# Patient Record
Sex: Female | Born: 1955 | Race: White | Hispanic: No | Marital: Married | Smoking: Never smoker
Health system: Southern US, Community
[De-identification: ages and names within clinical notes are randomized; demographics above are authoritative.]

## PROBLEM LIST (undated history)

## (undated) DIAGNOSIS — Z8739 Personal history of other diseases of the musculoskeletal system and connective tissue: Secondary | ICD-10-CM

## (undated) DIAGNOSIS — I8390 Asymptomatic varicose veins of unspecified lower extremity: Secondary | ICD-10-CM

## (undated) DIAGNOSIS — N949 Unspecified condition associated with female genital organs and menstrual cycle: Secondary | ICD-10-CM

## (undated) DIAGNOSIS — K269 Duodenal ulcer, unspecified as acute or chronic, without hemorrhage or perforation: Secondary | ICD-10-CM

## (undated) DIAGNOSIS — E785 Hyperlipidemia, unspecified: Secondary | ICD-10-CM

## (undated) DIAGNOSIS — M810 Age-related osteoporosis without current pathological fracture: Secondary | ICD-10-CM

## (undated) DIAGNOSIS — IMO0001 Reserved for inherently not codable concepts without codable children: Secondary | ICD-10-CM

## (undated) DIAGNOSIS — K5790 Diverticulosis of intestine, part unspecified, without perforation or abscess without bleeding: Secondary | ICD-10-CM

## (undated) DIAGNOSIS — M1990 Unspecified osteoarthritis, unspecified site: Secondary | ICD-10-CM

## (undated) DIAGNOSIS — K8020 Calculus of gallbladder without cholecystitis without obstruction: Secondary | ICD-10-CM

## (undated) DIAGNOSIS — K5792 Diverticulitis of intestine, part unspecified, without perforation or abscess without bleeding: Secondary | ICD-10-CM

## (undated) DIAGNOSIS — R030 Elevated blood-pressure reading, without diagnosis of hypertension: Secondary | ICD-10-CM

## (undated) DIAGNOSIS — K862 Cyst of pancreas: Secondary | ICD-10-CM

## (undated) DIAGNOSIS — I700 Atherosclerosis of aorta: Secondary | ICD-10-CM

## (undated) DIAGNOSIS — D649 Anemia, unspecified: Secondary | ICD-10-CM

## (undated) DIAGNOSIS — T148XXA Other injury of unspecified body region, initial encounter: Secondary | ICD-10-CM

## (undated) DIAGNOSIS — K828 Other specified diseases of gallbladder: Secondary | ICD-10-CM

## (undated) HISTORY — PX: TOE SURGERY: SHX1073

## (undated) HISTORY — PX: ABDOMINAL HYSTERECTOMY: SHX81

## (undated) HISTORY — PX: SHOULDER SURGERY: SHX246

## (undated) HISTORY — DX: Atherosclerosis of aorta: I70.0

## (undated) HISTORY — DX: Calculus of gallbladder without cholecystitis without obstruction: K80.20

## (undated) HISTORY — PX: MANDIBLE SURGERY: SHX707

## (undated) HISTORY — DX: Cyst of pancreas: K86.2

## (undated) HISTORY — DX: Anemia, unspecified: D64.9

## (undated) HISTORY — PX: COLONOSCOPY: SHX174

## (undated) HISTORY — PX: INCONTINENCE SURGERY: SHX676

## (undated) HISTORY — PX: BACK SURGERY: SHX140

## (undated) HISTORY — PX: ABLATION: SHX5711

## (undated) HISTORY — DX: Unspecified condition associated with female genital organs and menstrual cycle: N94.9

## (undated) HISTORY — PX: BREAST BIOPSY: SHX20

## (undated) HISTORY — PX: DORSAL COMPARTMENT RELEASE: SHX1474

## (undated) HISTORY — DX: Diverticulosis of intestine, part unspecified, without perforation or abscess without bleeding: K57.90

## (undated) HISTORY — PX: LUMBAR LAMINECTOMY: SHX95

## (undated) HISTORY — PX: ROTATOR CUFF REPAIR: SHX139

---

## 1988-02-02 DIAGNOSIS — D649 Anemia, unspecified: Secondary | ICD-10-CM

## 1988-02-02 HISTORY — DX: Anemia, unspecified: D64.9

## 1988-02-02 HISTORY — PX: VAGINAL HYSTERECTOMY: SUR661

## 1999-04-02 HISTORY — PX: LUMBAR LAMINECTOMY: SHX95

## 2004-07-02 ENCOUNTER — Ambulatory Visit: Payer: Self-pay

## 2004-12-01 ENCOUNTER — Ambulatory Visit: Payer: Self-pay | Admitting: Internal Medicine

## 2005-01-22 ENCOUNTER — Ambulatory Visit: Payer: Self-pay | Admitting: Gastroenterology

## 2006-01-10 ENCOUNTER — Ambulatory Visit: Payer: Self-pay | Admitting: Internal Medicine

## 2007-02-28 ENCOUNTER — Ambulatory Visit: Payer: Self-pay

## 2007-06-16 ENCOUNTER — Ambulatory Visit: Payer: Self-pay | Admitting: Vascular Surgery

## 2007-09-05 ENCOUNTER — Ambulatory Visit: Payer: Self-pay

## 2007-09-22 ENCOUNTER — Ambulatory Visit: Payer: Self-pay | Admitting: Vascular Surgery

## 2007-10-18 ENCOUNTER — Ambulatory Visit: Payer: Self-pay | Admitting: Vascular Surgery

## 2007-10-25 ENCOUNTER — Ambulatory Visit: Payer: Self-pay | Admitting: Vascular Surgery

## 2007-11-01 ENCOUNTER — Ambulatory Visit: Payer: Self-pay | Admitting: Vascular Surgery

## 2007-11-08 ENCOUNTER — Ambulatory Visit: Payer: Self-pay | Admitting: Vascular Surgery

## 2008-03-13 ENCOUNTER — Ambulatory Visit: Payer: Self-pay | Admitting: Internal Medicine

## 2008-03-20 ENCOUNTER — Ambulatory Visit: Payer: Self-pay | Admitting: Vascular Surgery

## 2008-07-03 ENCOUNTER — Ambulatory Visit: Payer: Self-pay | Admitting: Vascular Surgery

## 2009-03-18 ENCOUNTER — Ambulatory Visit: Payer: Self-pay | Admitting: Internal Medicine

## 2009-11-19 ENCOUNTER — Ambulatory Visit: Payer: Self-pay | Admitting: Internal Medicine

## 2010-05-19 ENCOUNTER — Ambulatory Visit: Payer: Self-pay | Admitting: Internal Medicine

## 2010-06-16 NOTE — Procedures (Signed)
DUPLEX DEEP VENOUS EXAM - LOWER EXTREMITY   INDICATION:  Follow up one week post left greater saphenous vein  ablation.   HISTORY:  Edema:  No.  Trauma/Surgery:  Left greater saphenous vein ablation on 11/01/07,  previous right greater saphenous vein ablation.  Pain:  Mild left leg pain.  PE:  No.  Previous DVT:  No.  Anticoagulants:  No.  Other:   DUPLEX EXAM:                CFV   SFV   PopV  PTV    GSV                R  L  R  L  R  L  R   L  R  L  Thrombosis       o     o     o      o     +  Spontaneous      +     +     +      +     o  Phasic           +     +     +      +     o  Augmentation     +     +     +      +     o  Compressible     +     +     +      +     o  Competent        +     +     +      +     o   Legend:  + - yes  o - no  p - partial  D - decreased   IMPRESSION:  1. Left greater saphenous vein is thrombosed.  No thrombus is seen in      the common femoral vein.  2. No deep venous thrombosis is seen in the left leg.    _____________________________  Larina Earthly, M.D.   MC/MEDQ  D:  11/08/2007  T:  11/08/2007  Job:  [id number]

## 2010-06-16 NOTE — Assessment & Plan Note (Signed)
OFFICE VISIT   Heather Snow, Heather Snow  DOB:  Dec 10, 1955                                       07/03/2008  CHART#:[id number]   The patient presents today for continued treatment of her venous  pathology.  She had been treated with bilateral laser ablation and had  excellent result with this from a symptom relief standpoint.  She had  had one session of sclerotherapy to telangiectasia over both calves.  She is here today for a second treatment.   She underwent a total of 2 mL of 0.3% sodium tetradecyl mixed half and  half with CO2.  These were to the posterior calves and lateral left  knee.  She had no immediate complications and had her stockings replaced  and will see [place] again on an as-needed basis.   Larina Earthly, M.D.  Electronically Signed   TFE/MEDQ  D:  07/03/2008  T:  07/04/2008  Job:  [id number]

## 2010-06-16 NOTE — Procedures (Signed)
LOWER EXTREMITY VENOUS REFLUX EXAM   INDICATION:  Pain and swelling in the bilateral lower extremities.   EXAM:  Using color-flow imaging and pulse Doppler spectral analysis, the  bilateral common femoral, superficial femoral, popliteal, posterior  tibial, greater and lesser saphenous veins are evaluated.  There is  evidence suggesting deep venous insufficiency in the bilateral lower  extremities.   The right saphenofemoral junction is competent.  The left saphenofemoral  junction is not competent.  The bilateral GSV's are not competent with  the caliber as described below.   The bilateral proximal short saphenous veins were not adequately  visualized.   GSV Diameter (used if found to be incompetent only)                                            Right    Left  Proximal Greater Saphenous Vein           0.6 cm   0.73 cm  Proximal-to-mid-thigh                     0.66 cm  0.73 cm  Mid thigh                                 0.53 cm  0.59 cm  Mid-distal thigh                          0.53 cm  0.59 cm  Distal thigh                              0.55 cm  0.52 cm  Knee                                      0.33 cm  0.34 cm    IMPRESSION:  1. Bilateral greater saphenous vein reflux is identified with the      caliber ranging from 0.3 cm to 0.66 cm knee to groin on the right      and 0.34 cm to 0.73 cm knee to groin on the left.  2. The bilateral greater saphenous veins are not aneurysmal or      tortuous.  3. The bilateral deep venous system is not competent.         ___________________________________________  Larina Earthly, M.D.   CH/MEDQ  D:  09/22/2007  T:  09/22/2007  Job:  [id number]

## 2010-06-16 NOTE — Procedures (Signed)
DUPLEX DEEP VENOUS EXAM - LOWER EXTREMITY   INDICATION:  Status post right greater saphenous vein ablation.   HISTORY:  Edema:  No.  Trauma/Surgery:  Yes.  Pain:  No.  PE:  No.  Previous DVT:  No.  Anticoagulants:  Other:   DUPLEX EXAM:                CFV   SFV   PopV  PTV    GSV                R  L  R  L  R  L  R   L  R  L  Thrombosis    o  o  o     o     o      +  Spontaneous   +  +  +     +     +      0  Phasic        +  +  +     +     +      0  Augmentation  +  +  +     +     +      0  Compressible  +  +  +     +     +      0  Competent     +  +  +     +     +      0   Legend:  + - yes  o - no  p - partial  D - decreased   IMPRESSION:  1. No evidence of deep venous thrombosis noted in the right leg.  2. The right greater saphenous vein appears ablated from the proximal      thigh to distal thigh.    _____________________________  Larina Earthly, M.D.   MG/MEDQ  D:  10/25/2007  T:  10/25/2007  Job:  [id number]

## 2010-06-16 NOTE — Consult Note (Signed)
NEW PATIENT CONSULTATION   Heather Snow, Heather Snow  DOB:  [birth year]                                       06/16/2007  CHART#:[id number]   The patient presents today for evaluation of severe bilateral venous  pathology.  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  She does have a strong family  history of venous varicosities in her mother and father.  She also has a  history of thrombophlebitis and varicosities in a sister.   PAST MEDICAL HISTORY:  Is significant for elevated cholesterol but  otherwise no major medical difficulties.  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  She reports  that she is having progressively severe pain associated with this, with  an aching sensation in her legs after a long day and also pain  specifically over the large varicosities in both legs.  She does not  have any history of deep venous thrombosis or bleeding.   SOCIAL HISTORY:  She is married with two children.  She works as an Charity fundraiser.  She does not smoke, or drink alcohol.   REVIEW OF SYSTEMS:  Positive for weight gain.  She currently reports her  weight at 145 pounds.  She is 5 feet 7 inches tall.  Her review of systems otherwise positive for a history of peptic ulcer  disease, does have a history of superficial thrombophlebitis.  ORTHO:  She does have arthritic and joint pain in her shoulder and hip.  HEMATOLOGIC:  She did have slightly low platelets.   ALLERGIES:  No known drug allergies.   MEDICATIONS:  Calcium, Fosamax, glucosamine, daily baby aspirin, and  Celebrex.   PHYSICAL EXAM:  Well-developed, well-nourished white female appearing  stated age of 2.  Blood pressure is 128/82, pulse 73, respirations 18.  Her radial pulses are 2+ bilaterally.  She has 2+ dorsalis pedis pulses  bilaterally.  Lower extremities are noted for marked bilateral  varicosities.  These continue throughout  her medial right thigh, and  typical aberrant pattern going from her groin down to her lateral knee  on the left and extending onto her calf.  She also has medial thigh  varicosities on the left as well.  She underwent screening duplex by me  and this revealed gross reflux in her saphenous vein in her upper and  mid thigh.  This does give rise to the varicosities at her mid to distal  thigh.  I had a long discussion with this patient regarding the  significance of this.  I feel that she does have significant symptoms  related to her venous hypertension both with leg achiness at the end of  the day and also pain specifically over the varicosities.  She does wear  a heavy-grade support hose, but has not worn graduated compression  garments.  We have fitted her today with a 20-30 mmHg thigh-high  graduated compression garments and instructed her on the use of these.  Will see her again in 3 months for a formal duplex evaluation to  determine if she is having successful symptom relief with conservative  care.  She is instructed on the continued elevation when possible and  also nonsteroidals for pain.  We will see her again in 3 months for  continued discussion.  Larina Earthly, M.D.  Electronically Signed   TFE/MEDQ  D:  06/16/2007  T:  06/19/2007  Job:  [id number]   cc:   Max T. Hyatt, D.P.M.  Dr. Mancel Bale

## 2010-06-16 NOTE — Assessment & Plan Note (Signed)
OFFICE VISIT   BREELEY, BISCHOF  DOB:  Jul 01, 1955                                       11/08/2007  CHART#:[id number]   Here for followup of her staged bilateral laser ablation and stab  phlebectomies most recently her left leg 1 week ago.  She is quite  thrilled with her result.  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  She reports that  she has had no discomfort with this, and this is the first time in years  she has not had pain with prolonged standing.  Her ablation site looks  good with no evidence of difficulties.  She did undergo repeat duplex  today of her left leg showing closure of her saphenous vein throughout  the treated segment and patency of her common femoral vein.  I am quite  pleased with her result, as is the patient.  She will see [place] again in 6  weeks for final followup.   Larina Earthly, M.D.  Electronically Signed   TFE/MEDQ  D:  11/08/2007  T:  11/09/2007  Job:  [id number]

## 2010-06-16 NOTE — Assessment & Plan Note (Signed)
OFFICE VISIT   Heather Snow, Heather Snow  DOB:  Mar 11, 1955                                       09/22/2007  CHART#:[id number]   Patient presents today for continued followup of her severe venous  hypertension and bilateral venous varicosities.  She has worn her  compression garments for the past three months and reports that she  continues to have discomfort with pain at the end of a prolonged shift.  She works as a Designer, jewellery with the correctional facility and has  prolonged standing up to 12 hour shifts and reports that her pain and  swelling makes it difficult to perform her job.  She also reports that  squatting, doing housework and yard work is difficult, and walks for  exercise but has to decrease this due to leg pain and swelling.   Her physical exam is unchanged.  She continues to have varicosities in  the medial aspect of both thighs and calves.   She underwent a formal venous duplex today, and this shows incompetence  in her greater saphenous veins bilaterally.  This does give reflux into  the tributary varicosities in both legs.   I discussed options with the patient.  She is an excellent candidate for  laser ablation and stab phlebectomy for relief of her venous  hypertension symptoms.  I explained the procedure under local anesthesia  with ablation of her saphenous vein and stab phlebectomy of the  tributary varicosities.  She wishes to proceed with this as soon as we  can assure insurance coverage.  She understands it would be a staged  procedure.   Larina Earthly, M.D.  Electronically Signed   TFE/MEDQ  D:  09/22/2007  T:  09/22/2007  Job:  [id number]

## 2010-06-16 NOTE — Assessment & Plan Note (Signed)
OFFICE VISIT   Heather Snow, Heather Snow  DOB:  April 15, 1955                                       03/20/2008  CHART#:[id number]   The patient is in today for follow-up of her bilateral of laser ablation  stab phlebectomies of venous varicosities.  She has had marked symptom  relief of her venous hypertension.  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  Discussed the procedure with her and then a total of 4 cc of 0.3% sodium  tetradecyl in 50/50 mix with CO2.  We focused on the areas on the  anterior calves and posterior calves and thighs.  She had no immediate  complication.  Her compression garments were applied and she will see me  again in 3-4 weeks.   Larina Earthly, M.D.  Electronically Signed   TFE/MEDQ  D:  03/20/2008  T:  03/21/2008  Job:  [id number]

## 2010-06-16 NOTE — Assessment & Plan Note (Signed)
OFFICE VISIT   Heather, Snow  DOB:  1955/07/20                                       10/25/2007  CHART#:[id number]   Patient presents today for a one-week followup of her laser ablation of  her right greater saphenous vein and stab phlebectomy of multiple  tributary varicosities.  She is quite pleased with her admission result  and reports that her leg feels much better than preoperative.   PHYSICAL EXAMINATION:  She does have the usual amount of bruising at the  phlebectomy sites throughout her thigh and calf.   She underwent duplex in our office, and this reveals no injury to her  deep system with wide patency and no thrombus and closure of her greater  saphenous vein.   She is quite pleased with her initial result, as am I, and plan to see  her again in one week for ablation and stab phlebectomy of her  contralateral greater saphenous vein on the left.   Larina Earthly, M.D.  Electronically Signed   TFE/MEDQ  D:  10/25/2007  T:  10/26/2007  Job:  [id number]   cc:   Max T. Hyatt, D.P.M.  Dr. Patric Dykes

## 2011-05-20 ENCOUNTER — Ambulatory Visit: Payer: Self-pay | Admitting: Internal Medicine

## 2011-11-23 ENCOUNTER — Ambulatory Visit: Payer: Self-pay | Admitting: Internal Medicine

## 2011-12-03 HISTORY — PX: DORSAL COMPARTMENT RELEASE: SHX1474

## 2012-07-05 ENCOUNTER — Ambulatory Visit: Payer: Self-pay | Admitting: Internal Medicine

## 2013-09-26 ENCOUNTER — Ambulatory Visit: Payer: Self-pay | Admitting: Internal Medicine

## 2013-12-19 ENCOUNTER — Ambulatory Visit: Payer: Self-pay | Admitting: Podiatry

## 2014-05-06 ENCOUNTER — Emergency Department
Admit: 2014-05-06 | Disposition: A | Discharge status: Home / Self Care | Payer: Self-pay | Admitting: Emergency Medicine

## 2014-05-15 ENCOUNTER — Encounter: Payer: Self-pay | Admitting: *Deleted

## 2014-11-01 ENCOUNTER — Other Ambulatory Visit: Payer: Self-pay | Admitting: Internal Medicine

## 2014-11-01 DIAGNOSIS — Z1231 Encounter for screening mammogram for malignant neoplasm of breast: Secondary | ICD-10-CM

## 2014-11-13 ENCOUNTER — Ambulatory Visit
Admission: RE | Admit: 2014-11-13 | Discharge: 2014-11-13 | Disposition: A | Discharge status: Home / Self Care | Payer: BC Managed Care – PPO | Source: Ambulatory Visit | Attending: Internal Medicine | Admitting: Internal Medicine

## 2014-11-13 DIAGNOSIS — Z1231 Encounter for screening mammogram for malignant neoplasm of breast: Secondary | ICD-10-CM

## 2015-01-16 ENCOUNTER — Encounter: Payer: Self-pay | Admitting: *Deleted

## 2015-01-17 ENCOUNTER — Ambulatory Visit: Payer: BC Managed Care – PPO | Admitting: Certified Registered"

## 2015-01-17 ENCOUNTER — Ambulatory Visit
Admission: RE | Admit: 2015-01-17 | Discharge: 2015-01-17 | Disposition: A | Discharge status: Home / Self Care | Payer: BC Managed Care – PPO | Source: Ambulatory Visit | Attending: Gastroenterology | Admitting: Gastroenterology

## 2015-01-17 ENCOUNTER — Encounter
Admission: RE | Disposition: A | Discharge status: Home / Self Care | Payer: Self-pay | Source: Ambulatory Visit | Attending: Gastroenterology

## 2015-01-17 ENCOUNTER — Encounter: Payer: Self-pay | Admitting: *Deleted

## 2015-01-17 DIAGNOSIS — I739 Peripheral vascular disease, unspecified: Secondary | ICD-10-CM | POA: Diagnosis not present

## 2015-01-17 DIAGNOSIS — M1990 Unspecified osteoarthritis, unspecified site: Secondary | ICD-10-CM | POA: Diagnosis not present

## 2015-01-17 DIAGNOSIS — Z7982 Long term (current) use of aspirin: Secondary | ICD-10-CM | POA: Insufficient documentation

## 2015-01-17 DIAGNOSIS — Z1211 Encounter for screening for malignant neoplasm of colon: Secondary | ICD-10-CM | POA: Insufficient documentation

## 2015-01-17 DIAGNOSIS — Z79899 Other long term (current) drug therapy: Secondary | ICD-10-CM | POA: Insufficient documentation

## 2015-01-17 DIAGNOSIS — M810 Age-related osteoporosis without current pathological fracture: Secondary | ICD-10-CM | POA: Insufficient documentation

## 2015-01-17 DIAGNOSIS — E785 Hyperlipidemia, unspecified: Secondary | ICD-10-CM | POA: Insufficient documentation

## 2015-01-17 DIAGNOSIS — K5730 Diverticulosis of large intestine without perforation or abscess without bleeding: Secondary | ICD-10-CM | POA: Insufficient documentation

## 2015-01-17 HISTORY — DX: Unspecified osteoarthritis, unspecified site: M19.90

## 2015-01-17 HISTORY — DX: Elevated blood-pressure reading, without diagnosis of hypertension: R03.0

## 2015-01-17 HISTORY — DX: Duodenal ulcer, unspecified as acute or chronic, without hemorrhage or perforation: K26.9

## 2015-01-17 HISTORY — DX: Reserved for inherently not codable concepts without codable children: IMO0001

## 2015-01-17 HISTORY — DX: Hyperlipidemia, unspecified: E78.5

## 2015-01-17 HISTORY — PX: COLONOSCOPY WITH PROPOFOL: SHX5780

## 2015-01-17 HISTORY — DX: Personal history of other diseases of the musculoskeletal system and connective tissue: Z87.39

## 2015-01-17 HISTORY — DX: Age-related osteoporosis without current pathological fracture: M81.0

## 2015-01-17 HISTORY — DX: Other injury of unspecified body region, initial encounter: T14.8XXA

## 2015-01-17 HISTORY — DX: Asymptomatic varicose veins of unspecified lower extremity: I83.90

## 2015-01-17 LAB — BASIC METABOLIC PANEL
ANION GAP: 7 (ref 5–15)
BUN: 12 mg/dL (ref 6–20)
CALCIUM: 9 mg/dL (ref 8.9–10.3)
CO2: 28 mmol/L (ref 22–32)
CREATININE: 0.73 mg/dL (ref 0.44–1.00)
Chloride: 101 mmol/L (ref 101–111)
GLUCOSE: 93 mg/dL (ref 65–99)
Potassium: 4 mmol/L (ref 3.5–5.1)
Sodium: 136 mmol/L (ref 135–145)

## 2015-01-17 LAB — MAGNESIUM: Magnesium: 2 mg/dL (ref 1.7–2.4)

## 2015-01-17 SURGERY — COLONOSCOPY WITH PROPOFOL
Anesthesia: General

## 2015-01-17 MED ORDER — PROPOFOL 10 MG/ML IV BOLUS
INTRAVENOUS | Status: DC | PRN
  Administered 2015-01-17: 50 mg via INTRAVENOUS

## 2015-01-17 MED ORDER — PHENYLEPHRINE HCL 10 MG/ML IJ SOLN
INTRAMUSCULAR | Status: DC | PRN
  Administered 2015-01-17: 100 ug via INTRAVENOUS

## 2015-01-17 MED ORDER — SODIUM CHLORIDE 0.9 % IV SOLN: INTRAVENOUS | Status: DC

## 2015-01-17 MED ORDER — SODIUM CHLORIDE 0.9 % IV SOLN
INTRAVENOUS | Status: DC
  Administered 2015-01-17: 13:00:00 via INTRAVENOUS

## 2015-01-17 MED ORDER — FENTANYL CITRATE (PF) 100 MCG/2ML IJ SOLN
INTRAMUSCULAR | Status: DC | PRN
  Administered 2015-01-17: 25 ug via INTRAVENOUS

## 2015-01-17 MED ORDER — LIDOCAINE HCL (CARDIAC) 20 MG/ML IV SOLN
INTRAVENOUS | Status: DC | PRN
Start: 2015-01-17 — End: 2015-01-17
  Administered 2015-01-17: 60 mg via INTRAVENOUS

## 2015-01-17 MED ORDER — PROPOFOL 500 MG/50ML IV EMUL
INTRAVENOUS | Status: DC | PRN
Start: 2015-01-17 — End: 2015-01-17
  Administered 2015-01-17: 140 ug/kg/min via INTRAVENOUS

## 2015-01-17 NOTE — Transfer of Care (Signed)
Immediate Anesthesia Transfer of Care Note  Patient: Heather Snow  Procedure(s) Performed: Procedure(s): COLONOSCOPY WITH PROPOFOL (N/A)  Patient Location: Endoscopy Unit  Anesthesia Type:General  Level of Consciousness: awake, alert , oriented and patient cooperative  Airway & Oxygen Therapy: Patient Spontanous Breathing and Patient connected to nasal cannula oxygen  Post-op Assessment: Report given to RN, Post -op Vital signs reviewed and stable and Patient moving all extremities X 4  Post vital signs: Reviewed and stable  Last Vitals:  Filed Vitals:   01/17/15 1255  BP: 155/87  Pulse: 72  Temp: 36.5 C  Resp: 22    Complications: No apparent anesthesia complications
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
## 2015-01-17 NOTE — Op Note (Signed)
[facility] Gastroenterology Patient Name: Heather Snow Procedure Date: 01/17/2015 1:29 PM MRN: [id number] Account #: [id number] Date of Birth: 1955/06/03 Admit Type: Outpatient Age: [age] [facility] Gender: Female Note Status: Finalized Procedure:         Colonoscopy Indications:       Screening for colorectal malignant neoplasm Providers:         Christena DeemMartin U. Yanni Ruberg, MD Referring MD:      Daniel NonesBert Klein, MD (Referring MD) Medicines:         Monitored Anesthesia Care Complications:     No immediate complications. Procedure:         Pre-Anesthesia Assessment:                    - ASA Grade Assessment: II - A patient with mild systemic                     disease.                    After obtaining informed consent, the colonoscope was                     passed under direct vision. Throughout the procedure, the                     patient's blood pressure, pulse, and oxygen saturations                     were monitored continuously. The Colonoscope was                     introduced through the anus and advanced to the the cecum,                     identified by appendiceal orifice and ileocecal valve. The                     colonoscopy was performed with moderate difficulty due to                     a tortuous colon. Successful completion of the procedure                     was aided by using manual pressure. Findings:      Multiple small-mouthed diverticula were found in the sigmoid colon, in       the descending colon, in the transverse colon and in the ascending       colon. The ileocecal valve was turned open and noted to be normal.      The digital rectal exam was normal.      The sigmoid colon, descending colon, splenic flexure and hepatic flexure       were significantly tortuous. Impression:        - Diverticulosis in the sigmoid colon, in the descending                     colon, in the transverse colon and in the ascending colon.         - No specimens collected. Recommendation:    - Repeat colonoscopy in 10 years for screening purposes. Procedure Code(s): --- Professional ---                    [id number], Colonoscopy, flexible; diagnostic,  including                     collection of specimen(s) by brushing or washing, when                     performed (separate procedure) Diagnosis Code(s): --- Professional ---                    Z12.11, Encounter for screening for malignant neoplasm of                     colon                    K57.30, Diverticulosis of large intestine without                     perforation or abscess without bleeding CPT copyright 2014 American Medical Association. All rights reserved. The codes documented in this report are preliminary and upon coder review may  be revised to meet current compliance requirements. Christena Deem, MD 01/17/2015 2:14:56 PM This report has been signed electronically. Number of Addenda: 0 Note Initiated On: 01/17/2015 1:29 PM Scope Withdrawal Time: 0 hours 8 minutes 53 seconds  Total Procedure Duration: 0 hours 28 minutes 42 seconds       [facility]

## 2015-01-17 NOTE — Anesthesia Postprocedure Evaluation (Signed)
Anesthesia Post Note  Patient: Heather Snow  Procedure(s) Performed: Procedure(s) (LRB): COLONOSCOPY WITH PROPOFOL (N/A)  Patient location during evaluation: PACU Anesthesia Type: General Level of consciousness: awake and alert Pain management: pain level controlled Vital Signs Assessment: post-procedure vital signs reviewed and stable Respiratory status: spontaneous breathing, nonlabored ventilation, respiratory function stable and patient connected to nasal cannula oxygen Cardiovascular status: blood pressure returned to baseline and stable Postop Assessment: no signs of nausea or vomiting Anesthetic complications: no    Last Vitals:  Filed Vitals:   01/17/15 1436 01/17/15 1446  BP: 128/71 141/71  Pulse: 59 55  Temp:    Resp: 16 15    Last Pain: There were no vitals filed for this visit.               Yevette EdwardsJames G Adams

## 2015-01-17 NOTE — Anesthesia Preprocedure Evaluation (Signed)
Anesthesia Evaluation  Patient identified by MRN, date of birth, ID band Patient awake    Reviewed: Allergy & Precautions, H&P , NPO status , Patient's Chart, lab work & pertinent test results, reviewed documented beta blocker date and time   Airway Mallampati: II  TM Distance: >3 FB Neck ROM: full    Dental no notable dental hx.    Pulmonary neg pulmonary ROS,    Pulmonary exam normal breath sounds clear to auscultation       Cardiovascular Exercise Tolerance: Good + Peripheral Vascular Disease  negative cardio ROS   Rhythm:regular Rate:Normal     Neuro/Psych negative neurological ROS  negative psych ROS   GI/Hepatic negative GI ROS, Neg liver ROS, PUD,   Endo/Other  negative endocrine ROS  Renal/GU negative Renal ROS  negative genitourinary   Musculoskeletal negative musculoskeletal ROS (+) Arthritis ,   Abdominal   Peds negative pediatric ROS (+)  Hematology negative hematology ROS (+)   Anesthesia Other Findings   Reproductive/Obstetrics negative OB ROS                             Anesthesia Physical Anesthesia Plan  ASA: II  Anesthesia Plan: General   Post-op Pain Management:    Induction:   Airway Management Planned:   Additional Equipment:   Intra-op Plan:   Post-operative Plan:   Informed Consent: I have reviewed the patients History and Physical, chart, labs and discussed the procedure including the risks, benefits and alternatives for the proposed anesthesia with the patient or authorized representative who has indicated his/her understanding and acceptance.   Dental Advisory Given  Plan Discussed with: CRNA  Anesthesia Plan Comments:         Anesthesia Quick Evaluation

## 2015-01-18 ENCOUNTER — Encounter: Payer: Self-pay | Admitting: Gastroenterology

## 2015-10-15 ENCOUNTER — Other Ambulatory Visit: Payer: Self-pay | Admitting: Internal Medicine

## 2015-10-15 DIAGNOSIS — Z1231 Encounter for screening mammogram for malignant neoplasm of breast: Secondary | ICD-10-CM

## 2015-11-17 ENCOUNTER — Ambulatory Visit: Payer: BC Managed Care – PPO

## 2015-12-04 ENCOUNTER — Ambulatory Visit
Admission: RE | Admit: 2015-12-04 | Discharge: 2015-12-04 | Disposition: A | Discharge status: Home / Self Care | Payer: BC Managed Care – PPO | Source: Ambulatory Visit | Attending: Internal Medicine | Admitting: Internal Medicine

## 2015-12-04 DIAGNOSIS — Z1231 Encounter for screening mammogram for malignant neoplasm of breast: Secondary | ICD-10-CM | POA: Insufficient documentation

## 2016-10-28 ENCOUNTER — Other Ambulatory Visit: Payer: Self-pay | Admitting: Internal Medicine

## 2016-10-28 DIAGNOSIS — K862 Cyst of pancreas: Secondary | ICD-10-CM

## 2016-10-28 DIAGNOSIS — Z1231 Encounter for screening mammogram for malignant neoplasm of breast: Secondary | ICD-10-CM

## 2016-10-28 DIAGNOSIS — N949 Unspecified condition associated with female genital organs and menstrual cycle: Secondary | ICD-10-CM

## 2016-12-30 ENCOUNTER — Other Ambulatory Visit: Payer: Self-pay | Admitting: Internal Medicine

## 2016-12-30 ENCOUNTER — Ambulatory Visit
Admission: RE | Admit: 2016-12-30 | Discharge: 2016-12-30 | Disposition: A | Discharge status: Home / Self Care | Payer: BC Managed Care – PPO | Source: Ambulatory Visit | Attending: Internal Medicine | Admitting: Internal Medicine

## 2016-12-30 DIAGNOSIS — Z1231 Encounter for screening mammogram for malignant neoplasm of breast: Secondary | ICD-10-CM

## 2017-02-01 HISTORY — PX: BLADDER SUSPENSION: SHX72

## 2017-02-01 HISTORY — PX: BILATERAL OOPHORECTOMY: SHX1221

## 2017-03-22 ENCOUNTER — Ambulatory Visit: Payer: BC Managed Care – PPO | Attending: Internal Medicine | Admitting: Physical Therapy

## 2017-03-22 DIAGNOSIS — M542 Cervicalgia: Secondary | ICD-10-CM | POA: Insufficient documentation

## 2017-03-22 DIAGNOSIS — M6281 Muscle weakness (generalized): Secondary | ICD-10-CM | POA: Insufficient documentation

## 2017-03-24 NOTE — Therapy (Signed)
[facility] [address] [place], [place], [address] Phone: [phone]   Fax:  [phone]  Physical Therapy Evaluation  Patient Details  Name: Heather Snow Raiche MRN: [id number] Date of Birth: 04-02-1955 Referring Provider: Dr. Daniel NonesBert Klein   Encounter Date: 03/22/2017  PT End of Session - 03/24/17 1529    Visit Number  1    Number of Visits  1    Date for PT Re-Evaluation  03/23/17    PT Start Time  1502    PT Stop Time  1654    PT Time Calculation (min)  112 min    Activity Tolerance  Patient tolerated treatment well;Patient limited by pain    Behavior During Therapy  [facility] for tasks assessed/performed       Past Medical History:  Diagnosis Date  . Crush injury    to the maxilla and mandible with surgical repair of this  . Duodenal ulcer    remote, questionalbe duodenal stricture as a residual from this  . Elevated BP   . H/O degenerative disc disease   . Hyperlipidemia   . Osteoarthritis   . Osteoporosis   . Varicosities of leg    bilateral    Past Surgical History:  Procedure Laterality Date  . ABDOMINAL HYSTERECTOMY    . ABLATION     s/p vein ablation  . BREAST BIOPSY Right 2000-?   benign- circular clip  . COLONOSCOPY    . COLONOSCOPY WITH PROPOFOL N/A 01/17/2015   Procedure: COLONOSCOPY WITH PROPOFOL;  Surgeon: Christena DeemMartin U Skulskie, MD;  [facility];  Service: Endoscopy;  Laterality: N/A;  . DORSAL COMPARTMENT RELEASE Left    first   . LUMBAR LAMINECTOMY    . MANDIBLE SURGERY     maxilla and mandible; crush injury repair    There were no vitals filed for this visit.       [facility] - 03/24/17 0001      Assessment   Medical Diagnosis  DJD/ Cervical Radiculopathy    Referring Provider  Dr. Daniel NonesBert Klein    Onset Date/Surgical Date  05/06/14    Prior Therapy  yes      Precautions   Precautions  Other (comment)    Precaution Comments  8# lifting restriction due to recent OBGYN  procedure.               Plan - 03/24/17 1751    Clinical Impression Statement  See FCE report.      Clinical Presentation  Stable    Clinical Decision Making  Low    Rehab Potential  Fair    PT Frequency  1x / week    PT Treatment/Interventions  ADLs/Self Care Home Management;Patient/family education;Therapeutic exercise;Therapeutic activities;Functional mobility training    PT Next Visit Plan  FCE only

## 2017-11-17 ENCOUNTER — Other Ambulatory Visit: Payer: Self-pay | Admitting: Internal Medicine

## 2017-11-17 DIAGNOSIS — Z1231 Encounter for screening mammogram for malignant neoplasm of breast: Secondary | ICD-10-CM

## 2018-01-05 ENCOUNTER — Ambulatory Visit
Admission: RE | Admit: 2018-01-05 | Discharge: 2018-01-05 | Disposition: A | Discharge status: Home / Self Care | Payer: BC Managed Care – PPO | Source: Ambulatory Visit | Attending: Internal Medicine | Admitting: Internal Medicine

## 2018-01-05 DIAGNOSIS — Z1231 Encounter for screening mammogram for malignant neoplasm of breast: Secondary | ICD-10-CM | POA: Diagnosis not present

## 2018-12-06 ENCOUNTER — Other Ambulatory Visit: Payer: Self-pay | Admitting: Internal Medicine

## 2018-12-06 DIAGNOSIS — Z1231 Encounter for screening mammogram for malignant neoplasm of breast: Secondary | ICD-10-CM

## 2019-01-11 ENCOUNTER — Ambulatory Visit
Admission: RE | Admit: 2019-01-11 | Discharge: 2019-01-11 | Disposition: A | Discharge status: Home / Self Care | Payer: BC Managed Care – PPO | Source: Ambulatory Visit | Attending: Internal Medicine | Admitting: Internal Medicine

## 2019-01-11 DIAGNOSIS — Z1231 Encounter for screening mammogram for malignant neoplasm of breast: Secondary | ICD-10-CM | POA: Diagnosis present

## 2019-12-14 ENCOUNTER — Other Ambulatory Visit: Payer: Self-pay | Admitting: Internal Medicine

## 2019-12-14 DIAGNOSIS — Z1231 Encounter for screening mammogram for malignant neoplasm of breast: Secondary | ICD-10-CM

## 2020-01-24 ENCOUNTER — Other Ambulatory Visit: Payer: Self-pay

## 2020-01-24 ENCOUNTER — Ambulatory Visit
Admission: RE | Admit: 2020-01-24 | Discharge: 2020-01-24 | Disposition: A | Discharge status: Home / Self Care | Payer: BC Managed Care – PPO | Source: Ambulatory Visit | Attending: Internal Medicine | Admitting: Internal Medicine

## 2020-01-24 DIAGNOSIS — Z1231 Encounter for screening mammogram for malignant neoplasm of breast: Secondary | ICD-10-CM | POA: Diagnosis present

## 2020-02-16 ENCOUNTER — Other Ambulatory Visit: Payer: BC Managed Care – PPO

## 2020-12-16 ENCOUNTER — Other Ambulatory Visit: Payer: Self-pay | Admitting: Internal Medicine

## 2020-12-16 DIAGNOSIS — Z1231 Encounter for screening mammogram for malignant neoplasm of breast: Secondary | ICD-10-CM

## 2021-01-28 ENCOUNTER — Other Ambulatory Visit: Payer: Self-pay

## 2021-01-28 ENCOUNTER — Ambulatory Visit
Admission: RE | Admit: 2021-01-28 | Discharge: 2021-01-28 | Disposition: A | Discharge status: Home / Self Care | Payer: Medicare PPO | Source: Ambulatory Visit | Attending: Internal Medicine | Admitting: Internal Medicine

## 2021-01-28 DIAGNOSIS — Z1231 Encounter for screening mammogram for malignant neoplasm of breast: Secondary | ICD-10-CM | POA: Diagnosis present

## 2021-09-16 ENCOUNTER — Other Ambulatory Visit: Payer: Self-pay | Admitting: Gastroenterology

## 2021-09-16 DIAGNOSIS — K5732 Diverticulitis of large intestine without perforation or abscess without bleeding: Secondary | ICD-10-CM

## 2021-09-18 ENCOUNTER — Ambulatory Visit
Admission: RE | Admit: 2021-09-18 | Discharge: 2021-09-18 | Disposition: A | Discharge status: Home / Self Care | Payer: Medicare PPO | Source: Ambulatory Visit | Attending: Gastroenterology | Admitting: Gastroenterology

## 2021-09-18 DIAGNOSIS — K5732 Diverticulitis of large intestine without perforation or abscess without bleeding: Secondary | ICD-10-CM | POA: Diagnosis present

## 2021-09-18 MED ORDER — IOHEXOL 300 MG/ML  SOLN
80.0000 mL | Freq: Once | INTRAMUSCULAR | Status: AC | PRN
  Administered 2021-09-18: 80 mL via INTRAVENOUS

## 2021-09-24 ENCOUNTER — Other Ambulatory Visit (HOSPITAL_COMMUNITY): Payer: Self-pay | Admitting: Surgery

## 2021-09-24 ENCOUNTER — Other Ambulatory Visit: Payer: Self-pay | Admitting: Surgery

## 2021-09-24 ENCOUNTER — Ambulatory Visit: Admission: RE | Admit: 2021-09-24 | Payer: Medicare PPO | Source: Ambulatory Visit

## 2021-09-24 DIAGNOSIS — R109 Unspecified abdominal pain: Secondary | ICD-10-CM

## 2021-09-28 ENCOUNTER — Ambulatory Visit
Admission: RE | Admit: 2021-09-28 | Discharge: 2021-09-28 | Disposition: A | Discharge status: Home / Self Care | Payer: Medicare PPO | Source: Ambulatory Visit | Attending: Surgery | Admitting: Surgery

## 2021-09-28 DIAGNOSIS — R109 Unspecified abdominal pain: Secondary | ICD-10-CM | POA: Diagnosis present

## 2021-09-28 MED ORDER — TECHNETIUM TC 99M MEBROFENIN IV KIT
5.1400 | PACK | Freq: Once | INTRAVENOUS | Status: AC | PRN
  Administered 2021-09-28: 5.14 via INTRAVENOUS

## 2021-09-28 MED ORDER — TECHNETIUM TC 99M MEBROFENIN IV KIT: 5.0000 | PACK | Freq: Once | INTRAVENOUS | Status: DC

## 2021-10-02 DIAGNOSIS — K635 Polyp of colon: Secondary | ICD-10-CM

## 2021-10-02 HISTORY — DX: Polyp of colon: K63.5

## 2021-10-08 ENCOUNTER — Other Ambulatory Visit: Payer: Medicare PPO

## 2021-10-12 ENCOUNTER — Ambulatory Visit: Payer: Self-pay | Admitting: Surgery

## 2021-10-12 NOTE — H&P (View-Only) (Signed)
Subjective:   CC: Abdominal pain, unspecified abdominal location [R10.9]  HPI: Heather Snow is a 66 y.o. female who was referred by Darryl Lent* for evaluation of above CC. Symptoms were first noted several days ago. Pain is sharp, confined to the right lower quadrant, without radiation. Associated with nothing specific, exacerbated by nothing specific.  Hx of diverticulitis on right side, similar area where pain is now. That resolved with abx.  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  Past Surgical History: has a past surgical history that includes Hysterectomy; Laminectomy Lumbar Spine (04/1999); Repair crush injury; S/P vein ablations (09/09); Left first dorsal compartment release (11/13); Colonoscopy (01/22/2005 [facility]); Colonoscopy (01/17/2015); and Arthroscopic Rotator Cuff Repair (Right, 02/2015).  Family History: family history includes Alcohol abuse in her son; Allergic rhinitis in her daughter and mother; Asthma in her maternal grandmother; Bipolar disorder in her son; Coronary Artery Disease (Blocked arteries around heart) in her mother and sister; Depression in her son; Heart failure in her father; Hyperlipidemia (Elevated cholesterol) in her mother; Obesity in her son; Osteoarthritis in her father and son; Osteoporosis (Thinning of bones) in her mother; Prostate cancer in her father; Thyroid disease in her daughter.  Social History: reports that she has never smoked.  She has never used smokeless tobacco. She reports that she does not drink alcohol and does not use drugs.  Current Medications: has a current medication list which includes the following prescription(s): acetaminophen, aspirin, calcium carbonate/vitamin d3, celecoxib, cholecalciferol, prolia, methocarbamol, omega 3-dha-epa-fish oil, and psyllium.  Allergies:  Allergies as of 09/23/2021 - Reviewed 09/23/2021  Allergen Reaction Noted  Nsaids (non-steroidal anti-inflammatory drug) Other (See Comments) 09/20/2013   ROS:  A 15 point review of systems was performed and pertinent positives and negatives noted in HPI   Objective:    BP (!) 159/81  Pulse 75  Ht 170.2 cm (5\' 7" )  Wt 56.7 kg (125 lb)  BMI 19.58 kg/m   Constitutional : No distress, cooperative, alert  Lymphatics/Throat: Supple with no lymphadenopathy  Respiratory: Clear to auscultation bilaterally  Cardiovascular: Regular rate and rhythm  Gastrointestinal: Soft, non-distended, no organomegaly. Focal TTP mcBurney's point, no radiation beyond. Minor RUQ and LUQ pain but no where near degree in RLQ  Musculoskeletal: Steady gait and movement  Skin: Cool and moist, lap surgical scars  Psychiatric: Normal affect, non-agitated, not confused    LABS:  - Lab Results  Component Value Date  WBC 4.7 09/22/2021  HGB 15.1 (H) 09/22/2021  HCT 46.8 09/22/2021  PLT 154 09/22/2021   - Lab Results  Component Value Date  NA 139 09/22/2021  K 4.9 09/22/2021  CL 101 09/22/2021  CO2 33.4 (H) 09/22/2021  BUN 16 09/22/2021  CREATININE 0.7 09/22/2021  CALCIUM 9.6 09/22/2021  ALB 4.4 09/22/2021  TBILI 0.5 09/22/2021  ALKPHOS 66 09/22/2021  AST 16 09/22/2021  ALT 10 09/22/2021  GLUCOSE 82 09/22/2021  GFR 83 09/22/2021    RADS: CLINICAL DATA: Right groin pain, nausea   EXAM:  CT ABDOMEN AND PELVIS WITH CONTRAST   TECHNIQUE:  Multidetector CT imaging of the abdomen and pelvis was performed  using the standard protocol  following bolus administration of  intravenous contrast.   RADIATION DOSE REDUCTION: This exam was performed according to the  departmental dose-optimization program which includes automated  exposure control, adjustment of the mA and/or kV according to  patient size and/or use of iterative reconstruction technique.   CONTRAST: 20mL OMNIPAQUE IOHEXOL 300 MG/ML SOLN   COMPARISON: None Available.   FINDINGS:  Lower chest: No acute abnormality.   Hepatobiliary: Cholelithiasis. The gallbladder is distended, the  gallbladder wall appears mildly thickened, and there is trace  pericholecystic fluid noted along the gallbladder fossa. Together,  the findings may reflect changes of acute cholecystitis. Liver  unremarkable. No intra or extrahepatic biliary ductal dilation.   Pancreas: 11 mm cystic lesions are seen within the pancreatic head  and body at axial image # 20 and # 27, series 2. These may represent  cystic lesions, dilated pancreatic side branches, or small  intrapancreatic pseudocysts. The pancreatic duct is not dilated.  Otherwise normal enhancement of the pancreatic parenchyma. No  peripancreatic inflammatory changes or fluid collections are  identified.   Spleen: Unremarkable   Adrenals/Urinary Tract: Adrenal glands are unremarkable. Kidneys are  normal, without renal calculi, focal lesion, or hydronephrosis.  Bladder is unremarkable.   Stomach/Bowel: Mild sigmoid diverticulosis. The stomach, small  bowel, and large bowel are otherwise unremarkable. No evidence of  obstruction or focal inflammation. No free intraperitoneal gas or  fluid.   Vascular/Lymphatic: Aortic atherosclerosis. Multiple left pelvic  varices are identified at the base of the bladder. No enlarged  abdominal or pelvic lymph nodes.   Reproductive: Status post hysterectomy. No adnexal masses.   Other: No abdominal wall hernia.   Musculoskeletal: No acute or significant osseous findings.    IMPRESSION:  1. Cholelithiasis with findings suspicious for acute cholecystitis.  Correlation with liver enzymes and clinical examination would be  helpful for further evaluation.  2. 11 mm cystic lesions within the pancreatic head and body. These  may represent cystic lesions, dilated pancreatic side branches, or  small intrapancreatic pseudocysts. In absence of prior examinations  to document stability, these can be further assessed with MRI  examination once the patient's acute issues have resolved.  3. Mild sigmoid diverticulosis.  4. Aortic atherosclerosis.   Aortic Atherosclerosis (ICD10-I70.0).   Electronically Signed  By: Helyn Numbers M.D.  On: 09/20/2021 20:37  Assessment:    Abdominal pain, unspecified abdominal location [R10.9] Imaging slightly concerning but labs, physical exam, history not consistent with acute cholecysitis, and more likely diverticulitis?  Plan:    1. Abdominal pain, unspecified abdominal location [R10.9] Will proceed with HIDA AND colonoscopy since still unable to decipher between the two. R/b/a discussed. Risks include bleeding, perforation. Benefits include diagnostic, curative procedure if needed. Alternatives include continued observation. Pt verbalized understanding.   UPDATE: HIDA positive.  Will proceed with robotic lap chole.  R/B/A discussed  labs/images/medications/previous chart entries reviewed personally and relevant changes/updates noted above.

## 2021-10-12 NOTE — H&P (Signed)
Subjective:   CC: Abdominal pain, unspecified abdominal location [R10.9]  HPI: Heather Snow is a 66 y.o. female who was referred by Cameron Trevor Locklear* for evaluation of above CC. Symptoms were first noted several days ago. Pain is sharp, confined to the right lower quadrant, without radiation. Associated with nothing specific, exacerbated by nothing specific.  Hx of diverticulitis on right side, similar area where pain is now. That resolved with abx.  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  Past Surgical History: has a past surgical history that includes Hysterectomy; Laminectomy Lumbar Spine (04/1999); Repair crush injury; S/P vein ablations (09/09); Left first dorsal compartment release (11/13); Colonoscopy (01/22/2005 [facility]); Colonoscopy (01/17/2015); and Arthroscopic Rotator Cuff Repair (Right, 02/2015).  Family History: family history includes Alcohol abuse in her son; Allergic rhinitis in her daughter and mother; Asthma in her maternal grandmother; Bipolar disorder in her son; Coronary Artery Disease (Blocked arteries around heart) in her mother and sister; Depression in her son; Heart failure in her father; Hyperlipidemia (Elevated cholesterol) in her mother; Obesity in her son; Osteoarthritis in her father and son; Osteoporosis (Thinning of bones) in her mother; Prostate cancer in her father; Thyroid disease in her daughter.  Social History: reports that she has never smoked.  She has never used smokeless tobacco. She reports that she does not drink alcohol and does not use drugs.  Current Medications: has a current medication list which includes the following prescription(s): acetaminophen, aspirin, calcium carbonate/vitamin d3, celecoxib, cholecalciferol, prolia, methocarbamol, omega 3-dha-epa-fish oil, and psyllium.  Allergies:  Allergies as of 09/23/2021 - Reviewed 09/23/2021  Allergen Reaction Noted  Nsaids (non-steroidal anti-inflammatory drug) Other (See Comments) 09/20/2013   ROS:  A 15 point review of systems was performed and pertinent positives and negatives noted in HPI   Objective:    BP (!) 159/81  Pulse 75  Ht 170.2 cm (5' 7")  Wt 56.7 kg (125 lb)  BMI 19.58 kg/m   Constitutional : No distress, cooperative, alert  Lymphatics/Throat: Supple with no lymphadenopathy  Respiratory: Clear to auscultation bilaterally  Cardiovascular: Regular rate and rhythm  Gastrointestinal: Soft, non-distended, no organomegaly. Focal TTP mcBurney's point, no radiation beyond. Minor RUQ and LUQ pain but no where near degree in RLQ  Musculoskeletal: Steady gait and movement  Skin: Cool and moist, lap surgical scars  Psychiatric: Normal affect, non-agitated, not confused    LABS:  - Lab Results  Component Value Date  WBC 4.7 09/22/2021  HGB 15.1 (H) 09/22/2021  HCT 46.8 09/22/2021  PLT 154 09/22/2021   - Lab Results  Component Value Date  NA 139 09/22/2021  K 4.9 09/22/2021  CL 101 09/22/2021  CO2 33.4 (H) 09/22/2021  BUN 16 09/22/2021  CREATININE 0.7 09/22/2021  CALCIUM 9.6 09/22/2021  ALB 4.4 09/22/2021  TBILI 0.5 09/22/2021  ALKPHOS 66 09/22/2021  AST 16 09/22/2021  ALT 10 09/22/2021  GLUCOSE 82 09/22/2021  GFR 83 09/22/2021    RADS: CLINICAL DATA: Right groin pain, nausea   EXAM:  CT ABDOMEN AND PELVIS WITH CONTRAST   TECHNIQUE:  Multidetector CT imaging of the abdomen and pelvis was performed  using the standard protocol    following bolus administration of  intravenous contrast.   RADIATION DOSE REDUCTION: This exam was performed according to the  departmental dose-optimization program which includes automated  exposure control, adjustment of the mA and/or kV according to  patient size and/or use of iterative reconstruction technique.   CONTRAST: 20mL OMNIPAQUE IOHEXOL 300 MG/ML SOLN   COMPARISON: None Available.   FINDINGS:  Lower chest: No acute abnormality.   Hepatobiliary: Cholelithiasis. The gallbladder is distended, the  gallbladder wall appears mildly thickened, and there is trace  pericholecystic fluid noted along the gallbladder fossa. Together,  the findings may reflect changes of acute cholecystitis. Liver  unremarkable. No intra or extrahepatic biliary ductal dilation.   Pancreas: 11 mm cystic lesions are seen within the pancreatic head  and body at axial image # 20 and # 27, series 2. These may represent  cystic lesions, dilated pancreatic side branches, or small  intrapancreatic pseudocysts. The pancreatic duct is not dilated.  Otherwise normal enhancement of the pancreatic parenchyma. No  peripancreatic inflammatory changes or fluid collections are  identified.   Spleen: Unremarkable   Adrenals/Urinary Tract: Adrenal glands are unremarkable. Kidneys are  normal, without renal calculi, focal lesion, or hydronephrosis.  Bladder is unremarkable.   Stomach/Bowel: Mild sigmoid diverticulosis. The stomach, small  bowel, and large bowel are otherwise unremarkable. No evidence of  obstruction or focal inflammation. No free intraperitoneal gas or  fluid.   Vascular/Lymphatic: Aortic atherosclerosis. Multiple left pelvic  varices are identified at the base of the bladder. No enlarged  abdominal or pelvic lymph nodes.   Reproductive: Status post hysterectomy. No adnexal masses.   Other: No abdominal wall hernia.   Musculoskeletal: No acute or significant osseous findings.    IMPRESSION:  1. Cholelithiasis with findings suspicious for acute cholecystitis.  Correlation with liver enzymes and clinical examination would be  helpful for further evaluation.  2. 11 mm cystic lesions within the pancreatic head and body. These  may represent cystic lesions, dilated pancreatic side branches, or  small intrapancreatic pseudocysts. In absence of prior examinations  to document stability, these can be further assessed with MRI  examination once the patient's acute issues have resolved.  3. Mild sigmoid diverticulosis.  4. Aortic atherosclerosis.   Aortic Atherosclerosis (ICD10-I70.0).   Electronically Signed  By: Helyn Numbers M.D.  On: 09/20/2021 20:37  Assessment:    Abdominal pain, unspecified abdominal location [R10.9] Imaging slightly concerning but labs, physical exam, history not consistent with acute cholecysitis, and more likely diverticulitis?  Plan:    1. Abdominal pain, unspecified abdominal location [R10.9] Will proceed with HIDA AND colonoscopy since still unable to decipher between the two. R/b/a discussed. Risks include bleeding, perforation. Benefits include diagnostic, curative procedure if needed. Alternatives include continued observation. Pt verbalized understanding.   UPDATE: HIDA positive.  Will proceed with robotic lap chole.  R/B/A discussed  labs/images/medications/previous chart entries reviewed personally and relevant changes/updates noted above.

## 2021-10-12 NOTE — H&P (View-Only) (Signed)
Subjective:   CC: Abdominal pain, unspecified abdominal location [R10.9]  HPI: Heather Snow is a 66 y.o. female who was referred by Cameron Trevor Locklear* for evaluation of above CC. Symptoms were first noted several days ago. Pain is sharp, confined to the right lower quadrant, without radiation. Associated with nothing specific, exacerbated by nothing specific.  Hx of diverticulitis on right side, similar area where pain is now. That resolved with abx.  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  Past Surgical History: has a past surgical history that includes Hysterectomy; Laminectomy Lumbar Spine (04/1999); Repair crush injury; S/P vein ablations (09/09); Left first dorsal compartment release (11/13); Colonoscopy (01/22/2005 [facility]); Colonoscopy (01/17/2015); and Arthroscopic Rotator Cuff Repair (Right, 02/2015).  Family History: family history includes Alcohol abuse in her son; Allergic rhinitis in her daughter and mother; Asthma in her maternal grandmother; Bipolar disorder in her son; Coronary Artery Disease (Blocked arteries around heart) in her mother and sister; Depression in her son; Heart failure in her father; Hyperlipidemia (Elevated cholesterol) in her mother; Obesity in her son; Osteoarthritis in her father and son; Osteoporosis (Thinning of bones) in her mother; Prostate cancer in her father; Thyroid disease in her daughter.  Social History: reports that she has never smoked.  She has never used smokeless tobacco. She reports that she does not drink alcohol and does not use drugs.  Current Medications: has a current medication list which includes the following prescription(s): acetaminophen, aspirin, calcium carbonate/vitamin d3, celecoxib, cholecalciferol, prolia, methocarbamol, omega 3-dha-epa-fish oil, and psyllium.  Allergies:  Allergies as of 09/23/2021 - Reviewed 09/23/2021  Allergen Reaction Noted  Nsaids (non-steroidal anti-inflammatory drug) Other (See Comments) 09/20/2013   ROS:  A 15 point review of systems was performed and pertinent positives and negatives noted in HPI   Objective:    BP (!) 159/81  Pulse 75  Ht 170.2 cm (5' 7")  Wt 56.7 kg (125 lb)  BMI 19.58 kg/m   Constitutional : No distress, cooperative, alert  Lymphatics/Throat: Supple with no lymphadenopathy  Respiratory: Clear to auscultation bilaterally  Cardiovascular: Regular rate and rhythm  Gastrointestinal: Soft, non-distended, no organomegaly. Focal TTP mcBurney's point, no radiation beyond. Minor RUQ and LUQ pain but no where near degree in RLQ  Musculoskeletal: Steady gait and movement  Skin: Cool and moist, lap surgical scars  Psychiatric: Normal affect, non-agitated, not confused    LABS:  - Lab Results  Component Value Date  WBC 4.7 09/22/2021  HGB 15.1 (H) 09/22/2021  HCT 46.8 09/22/2021  PLT 154 09/22/2021   - Lab Results  Component Value Date  NA 139 09/22/2021  K 4.9 09/22/2021  CL 101 09/22/2021  CO2 33.4 (H) 09/22/2021  BUN 16 09/22/2021  CREATININE 0.7 09/22/2021  CALCIUM 9.6 09/22/2021  ALB 4.4 09/22/2021  TBILI 0.5 09/22/2021  ALKPHOS 66 09/22/2021  AST 16 09/22/2021  ALT 10 09/22/2021  GLUCOSE 82 09/22/2021  GFR 83 09/22/2021    RADS: CLINICAL DATA: Right groin pain, nausea   EXAM:  CT ABDOMEN AND PELVIS WITH CONTRAST   TECHNIQUE:  Multidetector CT imaging of the abdomen and pelvis was performed  using the standard protocol    following bolus administration of  intravenous contrast.   RADIATION DOSE REDUCTION: This exam was performed according to the  departmental dose-optimization program which includes automated  exposure control, adjustment of the mA and/or kV according to  patient size and/or use of iterative reconstruction technique.   CONTRAST: 20mL OMNIPAQUE IOHEXOL 300 MG/ML SOLN   COMPARISON: None Available.   FINDINGS:  Lower chest: No acute abnormality.   Hepatobiliary: Cholelithiasis. The gallbladder is distended, the  gallbladder wall appears mildly thickened, and there is trace  pericholecystic fluid noted along the gallbladder fossa. Together,  the findings may reflect changes of acute cholecystitis. Liver  unremarkable. No intra or extrahepatic biliary ductal dilation.   Pancreas: 11 mm cystic lesions are seen within the pancreatic head  and body at axial image # 20 and # 27, series 2. These may represent  cystic lesions, dilated pancreatic side branches, or small  intrapancreatic pseudocysts. The pancreatic duct is not dilated.  Otherwise normal enhancement of the pancreatic parenchyma. No  peripancreatic inflammatory changes or fluid collections are  identified.   Spleen: Unremarkable   Adrenals/Urinary Tract: Adrenal glands are unremarkable. Kidneys are  normal, without renal calculi, focal lesion, or hydronephrosis.  Bladder is unremarkable.   Stomach/Bowel: Mild sigmoid diverticulosis. The stomach, small  bowel, and large bowel are otherwise unremarkable. No evidence of  obstruction or focal inflammation. No free intraperitoneal gas or  fluid.   Vascular/Lymphatic: Aortic atherosclerosis. Multiple left pelvic  varices are identified at the base of the bladder. No enlarged  abdominal or pelvic lymph nodes.   Reproductive: Status post hysterectomy. No adnexal masses.   Other: No abdominal wall hernia.   Musculoskeletal: No acute or significant osseous findings.    IMPRESSION:  1. Cholelithiasis with findings suspicious for acute cholecystitis.  Correlation with liver enzymes and clinical examination would be  helpful for further evaluation.  2. 11 mm cystic lesions within the pancreatic head and body. These  may represent cystic lesions, dilated pancreatic side branches, or  small intrapancreatic pseudocysts. In absence of prior examinations  to document stability, these can be further assessed with MRI  examination once the patient's acute issues have resolved.  3. Mild sigmoid diverticulosis.  4. Aortic atherosclerosis.   Aortic Atherosclerosis (ICD10-I70.0).   Electronically Signed  By: Helyn Numbers M.D.  On: 09/20/2021 20:37  Assessment:    Abdominal pain, unspecified abdominal location [R10.9] Imaging slightly concerning but labs, physical exam, history not consistent with acute cholecysitis, and more likely diverticulitis?  Plan:    1. Abdominal pain, unspecified abdominal location [R10.9] Will proceed with HIDA AND colonoscopy since still unable to decipher between the two. R/b/a discussed. Risks include bleeding, perforation. Benefits include diagnostic, curative procedure if needed. Alternatives include continued observation. Pt verbalized understanding.   UPDATE: HIDA positive.  Will proceed with robotic lap chole.  R/B/A discussed  labs/images/medications/previous chart entries reviewed personally and relevant changes/updates noted above.

## 2021-10-15 ENCOUNTER — Encounter: Payer: Self-pay | Admitting: Surgery

## 2021-10-15 ENCOUNTER — Other Ambulatory Visit: Payer: Medicare PPO

## 2021-10-15 ENCOUNTER — Encounter
Admission: RE | Disposition: A | Discharge status: Home / Self Care | Payer: Self-pay | Source: Home / Self Care | Attending: Surgery

## 2021-10-15 ENCOUNTER — Ambulatory Visit
Admission: RE | Admit: 2021-10-15 | Discharge: 2021-10-15 | Disposition: A | Discharge status: Home / Self Care | Payer: Medicare PPO | Attending: Surgery | Admitting: Surgery

## 2021-10-15 ENCOUNTER — Ambulatory Visit: Payer: Medicare PPO | Admitting: Anesthesiology

## 2021-10-15 DIAGNOSIS — K635 Polyp of colon: Secondary | ICD-10-CM | POA: Insufficient documentation

## 2021-10-15 DIAGNOSIS — K5730 Diverticulosis of large intestine without perforation or abscess without bleeding: Secondary | ICD-10-CM | POA: Diagnosis present

## 2021-10-15 DIAGNOSIS — Z538 Procedure and treatment not carried out for other reasons: Secondary | ICD-10-CM | POA: Diagnosis not present

## 2021-10-15 DIAGNOSIS — Q438 Other specified congenital malformations of intestine: Secondary | ICD-10-CM | POA: Diagnosis not present

## 2021-10-15 DIAGNOSIS — Z8262 Family history of osteoporosis: Secondary | ICD-10-CM | POA: Insufficient documentation

## 2021-10-15 DIAGNOSIS — R109 Unspecified abdominal pain: Secondary | ICD-10-CM | POA: Insufficient documentation

## 2021-10-15 DIAGNOSIS — M1990 Unspecified osteoarthritis, unspecified site: Secondary | ICD-10-CM | POA: Diagnosis not present

## 2021-10-15 HISTORY — PX: COLONOSCOPY WITH PROPOFOL: SHX5780

## 2021-10-15 SURGERY — COLONOSCOPY WITH PROPOFOL
Anesthesia: General | Site: Rectum

## 2021-10-15 MED ORDER — PROPOFOL 10 MG/ML IV BOLUS
INTRAVENOUS | Status: DC | PRN
  Administered 2021-10-15: 20 mg via INTRAVENOUS
  Administered 2021-10-15: 10 mg via INTRAVENOUS
  Administered 2021-10-15: 40 mg via INTRAVENOUS

## 2021-10-15 MED ORDER — LIDOCAINE HCL (CARDIAC) PF 100 MG/5ML IV SOSY
PREFILLED_SYRINGE | INTRAVENOUS | Status: DC | PRN
  Administered 2021-10-15: 60 mg via INTRAVENOUS

## 2021-10-15 MED ORDER — LIDOCAINE HCL (PF) 2 % IJ SOLN
INTRAMUSCULAR | Status: AC
  Filled 2021-10-15: qty 5

## 2021-10-15 MED ORDER — MIDAZOLAM HCL 2 MG/2ML IJ SOLN
INTRAMUSCULAR | Status: AC
  Filled 2021-10-15: qty 2

## 2021-10-15 MED ORDER — PROPOFOL 500 MG/50ML IV EMUL
INTRAVENOUS | Status: DC | PRN
  Administered 2021-10-15: 50 ug/kg/min via INTRAVENOUS

## 2021-10-15 MED ORDER — MIDAZOLAM HCL 2 MG/2ML IJ SOLN
INTRAMUSCULAR | Status: DC | PRN
  Administered 2021-10-15: 2 mg via INTRAVENOUS

## 2021-10-15 MED ORDER — SODIUM CHLORIDE 0.9 % IV SOLN: INTRAVENOUS | Status: DC

## 2021-10-15 NOTE — Anesthesia Postprocedure Evaluation (Signed)
Anesthesia Post Note  Patient: Heather Snow  Procedure(s) Performed: COLONOSCOPY WITH PROPOFOL (Rectum)  Patient location during evaluation: PACU Anesthesia Type: General Level of consciousness: awake and oriented Pain management: pain level controlled Vital Signs Assessment: post-procedure vital signs reviewed and stable Respiratory status: spontaneous breathing and respiratory function stable Cardiovascular status: stable Anesthetic complications: no   No notable events documented.   Last Vitals:  Vitals:   10/15/21 1120 10/15/21 1245  BP: 130/87 (!) 110/57  Pulse: 76 65  Resp: 16 16  Temp: (!) 36.1 C (!) 35.9 C  SpO2: 100% 98%    Last Pain:  Vitals:   10/15/21 1245  TempSrc: Temporal  PainSc: 0-No pain                 VAN STAVEREN,Albana Saperstein

## 2021-10-15 NOTE — Anesthesia Preprocedure Evaluation (Signed)
Anesthesia Evaluation  Patient identified by MRN, date of birth, ID band Patient awake    Reviewed: Allergy & Precautions, NPO status , Patient's Chart, lab work & pertinent test results  Airway Mallampati: II  TM Distance: >3 FB Neck ROM: full    Dental  (+) Teeth Intact   Pulmonary neg pulmonary ROS,    Pulmonary exam normal        Cardiovascular Exercise Tolerance: Good negative cardio ROS Normal cardiovascular exam Rhythm:Regular Rate:Normal     Neuro/Psych negative neurological ROS  negative psych ROS   GI/Hepatic negative GI ROS, Neg liver ROS, PUD,   Endo/Other  negative endocrine ROS  Renal/GU negative Renal ROS  negative genitourinary   Musculoskeletal  (+) Arthritis ,   Abdominal Normal abdominal exam  (+)   Peds negative pediatric ROS (+)  Hematology negative hematology ROS (+)   Anesthesia Other Findings Past Medical History: No date: Crush injury     Comment:  to the maxilla and mandible with surgical repair of this No date: Duodenal ulcer     Comment:  remote, questionalbe duodenal stricture as a residual               from this No date: Elevated BP No date: H/O degenerative disc disease No date: Hyperlipidemia No date: Osteoarthritis No date: Osteoporosis No date: Varicosities of leg     Comment:  bilateral  Past Surgical History: No date: ABDOMINAL HYSTERECTOMY No date: ABLATION     Comment:  s/p vein ablation No date: BACK SURGERY 2000-?: BREAST BIOPSY; Right     Comment:  benign- circular clip No date: COLONOSCOPY 01/17/2015: COLONOSCOPY WITH PROPOFOL; N/A     Comment:  Procedure: COLONOSCOPY WITH PROPOFOL;  Surgeon: Christena Deem, MD;  Location: [facility] ENDOSCOPY;  Service:               Endoscopy;  Laterality: N/A; No date: DORSAL COMPARTMENT RELEASE; Left     Comment:  first  No date: LUMBAR LAMINECTOMY No date: MANDIBLE SURGERY     Comment:  maxilla and  mandible; crush injury repair  BMI    Body Mass Index: 19.62 kg/m      Reproductive/Obstetrics negative OB ROS                             Anesthesia Physical Anesthesia Plan  ASA: 2  Anesthesia Plan: General   Post-op Pain Management:    Induction: Intravenous  PONV Risk Score and Plan: Propofol infusion and TIVA  Airway Management Planned: Natural Airway  Additional Equipment:   Intra-op Plan:   Post-operative Plan:   Informed Consent: I have reviewed the patients History and Physical, chart, labs and discussed the procedure including the risks, benefits and alternatives for the proposed anesthesia with the patient or authorized representative who has indicated his/her understanding and acceptance.     Dental Advisory Given  Plan Discussed with: CRNA and Surgeon  Anesthesia Plan Comments:         Anesthesia Quick Evaluation

## 2021-10-15 NOTE — Op Note (Signed)
[facility] Gastroenterology Patient Name: Heather Snow Procedure Date: 10/15/2021 11:55 AM MRN: [id number] Account #: [id number] Date of Birth: 10-16-1955 Admit Type: Outpatient Age: 66 Room: [facility] ENDO ROOM 3 Gender: Female Note Status: Finalized Instrument Name: Prentice Docker [id number] Procedure:             Colonoscopy Indications:           Diverticulosis of the colon Providers:             Arville Go MD, MD Referring MD:          Daniel Nones, MD (Referring MD) Medicines:             Propofol per Anesthesia Complications:         No immediate complications. Procedure:             Pre-Anesthesia Assessment:                        - After reviewing the risks and benefits, the patient                         was deemed in satisfactory condition to undergo the                         procedure in an ambulatory setting.                        After obtaining informed consent, the colonoscope was                         passed under direct vision. Throughout the procedure,                         the patient's blood pressure, pulse, and oxygen                         saturations were monitored continuously. The                         Colonoscope was introduced through the anus and                         advanced to the the sigmoid colon. The colonoscopy was                         extremely difficult due to a tortuous colon.                         Successful completion of the procedure was aided by                         {skip}. Findings:      The perianal and digital rectal examinations were normal.      A 3 mm polyp was found in the sigmoid colon. The polyp was sessile.       Biopsies were taken with a cold forceps for histology. Estimated blood       loss was minimal. Impression:            - One 3 mm polyp in the sigmoid colon. Biopsied.                        -  The procedure was aborted due to the extreme                         difficulty of the procedure  and a tortuous colon. Recommendation:        - Repeat colonoscopy PRN for surveillance.                        - Return to GI office. Procedure Code(s):     --- Professional ---                        [phone], Sigmoidoscopy, flexible; with biopsy, single or                         multiple Diagnosis Code(s):     --- Professional ---                        K63.5, Polyp of colon                        K57.30, Diverticulosis of large intestine without                         perforation or abscess without bleeding CPT copyright 2019 American Medical Association. All rights reserved. The codes documented in this report are preliminary and upon coder review may  be revised to meet current compliance requirements. Dr. Harrie Foreman, MD Arville Go MD, MD 10/15/2021 12:50:22 PM This report has been signed electronically. Number of Addenda: 0 Note Initiated On: 10/15/2021 11:55 AM Total Procedure Duration: 0 hours 38 minutes 58 seconds  Estimated Blood Loss:  Estimated blood loss was minimal.      [facility]

## 2021-10-15 NOTE — Interval H&P Note (Signed)
History and Physical Interval Note:  10/15/2021 11:34 AM  Heather Snow  has presented today for surgery, with the diagnosis of History of diverticulitis Z87.19.  The various methods of treatment have been discussed with the patient and family. After consideration of risks, benefits and other options for treatment, the patient has consented to  Procedure(s): COLONOSCOPY WITH PROPOFOL (N/A) as a surgical intervention.  The patient's history has been reviewed, patient examined, no change in status, stable for surgery.  I have reviewed the patient's chart and labs.  Questions were answered to the patient's satisfaction.    Even with positive HIDA, pt still requesting colonoscopy to assess previous diverticulitis episode area.  Will proceed as scheduled   Rayah Fines Tonna Boehringer

## 2021-10-15 NOTE — Transfer of Care (Signed)
Immediate Anesthesia Transfer of Care Note  Patient: JANAUTICA NETZLEY  Procedure(s) Performed: COLONOSCOPY WITH PROPOFOL (Rectum)  Patient Location: PACU  Anesthesia Type:General  Level of Consciousness: sedated  Airway & Oxygen Therapy: Patient Spontanous Breathing and Patient connected to nasal cannula oxygen  Post-op Assessment: Report given to RN and Post -op Vital signs reviewed and stable  Post vital signs: Reviewed and stable  Last Vitals:  Vitals Value Taken Time  BP 110/57 10/15/21 1245  Temp 35.9 C 10/15/21 1245  Pulse 65 10/15/21 1245  Resp 16 10/15/21 1245  SpO2 98 % 10/15/21 1245    Last Pain:  Vitals:   10/15/21 1245  TempSrc: Temporal  PainSc: 0-No pain         Complications: No notable events documented.

## 2021-10-16 ENCOUNTER — Ambulatory Visit
Admission: RE | Admit: 2021-10-16 | Discharge: 2021-10-16 | Disposition: A | Discharge status: Home / Self Care | Payer: Medicare PPO | Source: Ambulatory Visit | Attending: Surgery | Admitting: Surgery

## 2021-10-16 ENCOUNTER — Other Ambulatory Visit: Payer: Self-pay

## 2021-10-16 ENCOUNTER — Ambulatory Visit
Admission: RE | Admit: 2021-10-16 | Discharge: 2021-10-16 | Disposition: A | Discharge status: Home / Self Care | Payer: Medicare PPO | Source: Home / Self Care | Attending: Surgery | Admitting: Surgery

## 2021-10-16 DIAGNOSIS — Z01812 Encounter for preprocedural laboratory examination: Secondary | ICD-10-CM

## 2021-10-16 DIAGNOSIS — K5730 Diverticulosis of large intestine without perforation or abscess without bleeding: Secondary | ICD-10-CM | POA: Diagnosis not present

## 2021-10-16 LAB — SURGICAL PATHOLOGY

## 2021-10-16 NOTE — Patient Instructions (Addendum)
Your procedure is scheduled on: 10/22/21 - Thursday Report to the Registration Desk on the 1st floor of the [facility]. To find out your arrival time, please call [phone] between 1PM - 3PM on: 10/21/21 - Wednesday  If your arrival time is 6:00 am, do not arrive prior to that time as the [facility] entrance doors do not open until 6:00 am.  REMEMBER: Instructions that are not followed completely may result in serious medical risk, up to and including death; or upon the discretion of your surgeon and anesthesiologist your surgery may need to be rescheduled.  Do not eat food after midnight the night before surgery.  No gum chewing, lozengers or hard candies.  You may however, drink CLEAR liquids up to 2 hours before you are scheduled to arrive for your surgery. Do not drink anything within 2 hours of your scheduled arrival time.  Clear liquids include: - water  - apple juice without pulp - gatorade (not RED colors) - black coffee or tea (Do NOT add milk or creamers to the coffee or tea) Do NOT drink anything that is not on this list.  TAKE THESE MEDICATIONS THE MORNING OF SURGERY WITH A SIP OF WATER: NONE  One week prior to surgery: STOP Aspirin 7 days prior to your surgery. Stop Anti-inflammatories (NSAIDS) such as Advil, Aleve, Ibuprofen, Motrin, Naproxen, Naprosyn and Aspirin based products such as Excedrin, Goodys Powder, BC Powder.  Stop ANY OVER THE COUNTER supplements until after surgery.  You may however, continue to take Tylenol if needed for pain up until the day of surgery.  No Alcohol for 24 hours before or after surgery.  No Smoking including e-cigarettes for 24 hours prior to surgery.  No chewable tobacco products for at least 6 hours prior to surgery.  No nicotine patches on the day of surgery.  Do not use any "recreational" drugs for at least a week prior to your surgery.  Please be advised that the combination of cocaine and anesthesia may have negative  outcomes, up to and including death. If you test positive for cocaine, your surgery will be cancelled.  On the morning of surgery brush your teeth with toothpaste and water, you may rinse your mouth with mouthwash if you wish. Do not swallow any toothpaste or mouthwash.  Use CHG Soap or wipes as directed on instruction sheet.  Do not wear jewelry, make-up, hairpins, clips or nail polish.  Do not wear lotions, powders, or perfumes.   Do not shave body from the neck down 48 hours prior to surgery just in case you cut yourself which could leave a site for infection.  Also, freshly shaved skin may become irritated if using the CHG soap.  Contact lenses, hearing aids and dentures may not be worn into surgery.  Do not bring valuables to the hospital. [facility] is not responsible for any missing/lost belongings or valuables.   Notify your doctor if there is any change in your medical condition (cold, fever, infection).  Wear comfortable clothing (specific to your surgery type) to the hospital.  After surgery, you can help prevent lung complications by doing breathing exercises.  Take deep breaths and cough every 1-2 hours. Your doctor may order a device called an Incentive Spirometer to help you take deep breaths. When coughing or sneezing, hold a pillow firmly against your incision with both hands. This is called "splinting." Doing this helps protect your incision. It also decreases belly discomfort.  If you are being admitted to  the hospital overnight, leave your suitcase in the car. After surgery it may be brought to your room.  If you are being discharged the day of surgery, you will not be allowed to drive home. You will need a responsible adult (18 years or older) to drive you home and stay with you that night.   If you are taking public transportation, you will need to have a responsible adult (18 years or older) with you. Please confirm with your physician that it is acceptable  to use public transportation.   Please call the Pre-admissions Testing Dept. at [phone] if you have any questions about these instructions.  Surgery Visitation Policy:  Patients undergoing a surgery or procedure may have two family members or support persons with them as long as the person is not COVID-19 positive or experiencing its symptoms.   Inpatient Visitation:    Visiting hours are 7 a.m. to 8 p.m. Up to four visitors are allowed at one time in a patient room, including children. The visitors may rotate out with other people during the day. One designated support person (adult) may remain overnight.

## 2021-10-18 ENCOUNTER — Other Ambulatory Visit
Admission: RE | Admit: 2021-10-18 | Discharge: 2021-10-18 | Disposition: A | Discharge status: Home / Self Care | Payer: Medicare PPO | Source: Ambulatory Visit | Attending: Family Medicine | Admitting: Family Medicine

## 2021-10-18 DIAGNOSIS — R1084 Generalized abdominal pain: Secondary | ICD-10-CM | POA: Diagnosis present

## 2021-10-18 LAB — COMPREHENSIVE METABOLIC PANEL
ALT: 12 U/L (ref 0–44)
AST: 16 U/L (ref 15–41)
Albumin: 4 g/dL (ref 3.5–5.0)
Alkaline Phosphatase: 60 U/L (ref 38–126)
Anion gap: 10 (ref 5–15)
BUN: 12 mg/dL (ref 8–23)
CO2: 29 mmol/L (ref 22–32)
Calcium: 8.9 mg/dL (ref 8.9–10.3)
Chloride: 97 mmol/L — ABNORMAL LOW (ref 98–111)
Creatinine, Ser: 0.69 mg/dL (ref 0.44–1.00)
GFR, Estimated: >60 mL/min (ref >60)
Glucose, Bld: 95 mg/dL (ref 70–99)
Potassium: 4.1 mmol/L (ref 3.5–5.1)
Sodium: 136 mmol/L (ref 135–145)
Total Bilirubin: 1.2 mg/dL (ref 0.3–1.2)
Total Protein: 7.5 g/dL (ref 6.5–8.1)

## 2021-10-19 ENCOUNTER — Encounter: Payer: Self-pay | Admitting: Surgery

## 2021-10-22 ENCOUNTER — Other Ambulatory Visit: Payer: Self-pay

## 2021-10-22 ENCOUNTER — Ambulatory Visit: Payer: Medicare PPO | Admitting: Anesthesiology

## 2021-10-22 ENCOUNTER — Ambulatory Visit
Admission: RE | Admit: 2021-10-22 | Discharge: 2021-10-22 | Disposition: A | Discharge status: Home / Self Care | Payer: Medicare PPO | Attending: Surgery | Admitting: Surgery

## 2021-10-22 ENCOUNTER — Encounter
Admission: RE | Disposition: A | Discharge status: Home / Self Care | Payer: Self-pay | Source: Home / Self Care | Attending: Surgery

## 2021-10-22 ENCOUNTER — Encounter: Payer: Self-pay | Admitting: Surgery

## 2021-10-22 DIAGNOSIS — K5730 Diverticulosis of large intestine without perforation or abscess without bleeding: Secondary | ICD-10-CM | POA: Insufficient documentation

## 2021-10-22 DIAGNOSIS — I10 Essential (primary) hypertension: Secondary | ICD-10-CM | POA: Diagnosis not present

## 2021-10-22 DIAGNOSIS — D649 Anemia, unspecified: Secondary | ICD-10-CM | POA: Diagnosis not present

## 2021-10-22 DIAGNOSIS — I700 Atherosclerosis of aorta: Secondary | ICD-10-CM | POA: Insufficient documentation

## 2021-10-22 DIAGNOSIS — M1990 Unspecified osteoarthritis, unspecified site: Secondary | ICD-10-CM | POA: Diagnosis not present

## 2021-10-22 DIAGNOSIS — K8010 Calculus of gallbladder with chronic cholecystitis without obstruction: Secondary | ICD-10-CM | POA: Insufficient documentation

## 2021-10-22 DIAGNOSIS — E785 Hyperlipidemia, unspecified: Secondary | ICD-10-CM | POA: Insufficient documentation

## 2021-10-22 DIAGNOSIS — K828 Other specified diseases of gallbladder: Secondary | ICD-10-CM | POA: Insufficient documentation

## 2021-10-22 DIAGNOSIS — K811 Chronic cholecystitis: Secondary | ICD-10-CM

## 2021-10-22 HISTORY — DX: Diverticulitis of intestine, part unspecified, without perforation or abscess without bleeding: K57.92

## 2021-10-22 HISTORY — DX: Other specified diseases of gallbladder: K82.8

## 2021-10-22 SURGERY — CHOLECYSTECTOMY, ROBOT-ASSISTED, LAPAROSCOPIC
Anesthesia: General | Site: Abdomen

## 2021-10-22 MED ORDER — INDOCYANINE GREEN 25 MG IV SOLR
1.2500 mg | Freq: Once | INTRAVENOUS | Status: AC
  Administered 2021-10-22: 1.25 mg via INTRAVENOUS
  Filled 2021-10-22: qty 0.5

## 2021-10-22 MED ORDER — FAMOTIDINE 20 MG PO TABS
ORAL_TABLET | ORAL | Status: AC
  Administered 2021-10-22: 20 mg via ORAL
  Filled 2021-10-22: qty 1

## 2021-10-22 MED ORDER — LIDOCAINE-EPINEPHRINE (PF) 1 %-1:200000 IJ SOLN
INTRAMUSCULAR | Status: DC | PRN
  Administered 2021-10-22: 35 mL via INTRAMUSCULAR

## 2021-10-22 MED ORDER — ROCURONIUM BROMIDE 10 MG/ML (PF) SYRINGE
PREFILLED_SYRINGE | INTRAVENOUS | Status: AC
  Filled 2021-10-22: qty 10

## 2021-10-22 MED ORDER — ACETAMINOPHEN 10 MG/ML IV SOLN
INTRAVENOUS | Status: AC
  Filled 2021-10-22: qty 100

## 2021-10-22 MED ORDER — DEXAMETHASONE SODIUM PHOSPHATE 10 MG/ML IJ SOLN
INTRAMUSCULAR | Status: AC
  Filled 2021-10-22: qty 1

## 2021-10-22 MED ORDER — CHLORHEXIDINE GLUCONATE 0.12 % MT SOLN
OROMUCOSAL | Status: AC
  Administered 2021-10-22: 15 mL via OROMUCOSAL
  Filled 2021-10-22: qty 15

## 2021-10-22 MED ORDER — FAMOTIDINE 20 MG PO TABS: 20.0000 mg | ORAL_TABLET | Freq: Once | ORAL | Status: AC

## 2021-10-22 MED ORDER — ORAL CARE MOUTH RINSE: 15.0000 mL | Freq: Once | OROMUCOSAL | Status: AC

## 2021-10-22 MED ORDER — ONDANSETRON HCL 4 MG/2ML IJ SOLN: 4.0000 mg | Freq: Once | INTRAMUSCULAR | Status: DC | PRN

## 2021-10-22 MED ORDER — FENTANYL CITRATE (PF) 100 MCG/2ML IJ SOLN
INTRAMUSCULAR | Status: DC | PRN
  Administered 2021-10-22 (×2): 50 ug via INTRAVENOUS

## 2021-10-22 MED ORDER — LIDOCAINE-EPINEPHRINE (PF) 1 %-1:200000 IJ SOLN
INTRAMUSCULAR | Status: AC
  Filled 2021-10-22: qty 30

## 2021-10-22 MED ORDER — PROPOFOL 10 MG/ML IV BOLUS
INTRAVENOUS | Status: AC
  Filled 2021-10-22: qty 20

## 2021-10-22 MED ORDER — HYDROCODONE-ACETAMINOPHEN 5-325 MG PO TABS: 1.0000 | ORAL_TABLET | Freq: Four times a day (QID) | ORAL | 0 refills | Status: DC | PRN

## 2021-10-22 MED ORDER — MIDAZOLAM HCL 2 MG/2ML IJ SOLN
INTRAMUSCULAR | Status: AC
  Filled 2021-10-22: qty 2

## 2021-10-22 MED ORDER — ROCURONIUM BROMIDE 100 MG/10ML IV SOLN
INTRAVENOUS | Status: DC | PRN
  Administered 2021-10-22: 10 mg via INTRAVENOUS
  Administered 2021-10-22: 40 mg via INTRAVENOUS

## 2021-10-22 MED ORDER — OXYCODONE HCL 5 MG/5ML PO SOLN: 5.0000 mg | Freq: Once | ORAL | Status: DC | PRN

## 2021-10-22 MED ORDER — ACETAMINOPHEN 10 MG/ML IV SOLN: 1000.0000 mg | Freq: Once | INTRAVENOUS | Status: DC | PRN

## 2021-10-22 MED ORDER — LACTATED RINGERS IV SOLN: INTRAVENOUS | Status: DC

## 2021-10-22 MED ORDER — FENTANYL CITRATE (PF) 100 MCG/2ML IJ SOLN
INTRAMUSCULAR | Status: AC
  Filled 2021-10-22: qty 2

## 2021-10-22 MED ORDER — DOCUSATE SODIUM 100 MG PO CAPS: 100.0000 mg | ORAL_CAPSULE | Freq: Two times a day (BID) | ORAL | 0 refills | Status: AC | PRN

## 2021-10-22 MED ORDER — ONDANSETRON HCL 4 MG/2ML IJ SOLN
INTRAMUSCULAR | Status: DC | PRN
  Administered 2021-10-22: 4 mg via INTRAVENOUS

## 2021-10-22 MED ORDER — CHLORHEXIDINE GLUCONATE CLOTH 2 % EX PADS
6.0000 | MEDICATED_PAD | Freq: Once | CUTANEOUS | Status: AC
  Administered 2021-10-22: 6 via TOPICAL

## 2021-10-22 MED ORDER — FENTANYL CITRATE (PF) 100 MCG/2ML IJ SOLN: 25.0000 ug | INTRAMUSCULAR | Status: DC | PRN

## 2021-10-22 MED ORDER — CEFAZOLIN SODIUM-DEXTROSE 2-4 GM/100ML-% IV SOLN
INTRAVENOUS | Status: AC
  Filled 2021-10-22: qty 100

## 2021-10-22 MED ORDER — PROPOFOL 10 MG/ML IV BOLUS
INTRAVENOUS | Status: DC | PRN
  Administered 2021-10-22: 130 mg via INTRAVENOUS

## 2021-10-22 MED ORDER — BUPIVACAINE HCL (PF) 0.5 % IJ SOLN
INTRAMUSCULAR | Status: AC
  Filled 2021-10-22: qty 30

## 2021-10-22 MED ORDER — ONDANSETRON HCL 4 MG/2ML IJ SOLN
INTRAMUSCULAR | Status: AC
  Filled 2021-10-22: qty 2

## 2021-10-22 MED ORDER — SUCCINYLCHOLINE CHLORIDE 200 MG/10ML IV SOSY
PREFILLED_SYRINGE | INTRAVENOUS | Status: AC
  Filled 2021-10-22: qty 10

## 2021-10-22 MED ORDER — PHENYLEPHRINE HCL (PRESSORS) 10 MG/ML IV SOLN
INTRAVENOUS | Status: DC | PRN
  Administered 2021-10-22: 80 ug via INTRAVENOUS
  Administered 2021-10-22: 160 ug via INTRAVENOUS
  Administered 2021-10-22 (×2): 80 ug via INTRAVENOUS
  Administered 2021-10-22: 160 ug via INTRAVENOUS
  Administered 2021-10-22 (×2): 80 ug via INTRAVENOUS

## 2021-10-22 MED ORDER — MIDAZOLAM HCL 2 MG/2ML IJ SOLN
INTRAMUSCULAR | Status: DC | PRN
  Administered 2021-10-22 (×2): 1 mg via INTRAVENOUS

## 2021-10-22 MED ORDER — LIDOCAINE HCL (PF) 2 % IJ SOLN
INTRAMUSCULAR | Status: AC
  Filled 2021-10-22: qty 5

## 2021-10-22 MED ORDER — LIDOCAINE HCL (CARDIAC) PF 100 MG/5ML IV SOSY
PREFILLED_SYRINGE | INTRAVENOUS | Status: DC | PRN
  Administered 2021-10-22: 60 mg via INTRAVENOUS

## 2021-10-22 MED ORDER — SUGAMMADEX SODIUM 200 MG/2ML IV SOLN
INTRAVENOUS | Status: DC | PRN
  Administered 2021-10-22: 200 mg via INTRAVENOUS

## 2021-10-22 MED ORDER — ACETAMINOPHEN 10 MG/ML IV SOLN
INTRAVENOUS | Status: DC | PRN
  Administered 2021-10-22: 1000 mg via INTRAVENOUS

## 2021-10-22 MED ORDER — CEFAZOLIN SODIUM-DEXTROSE 2-4 GM/100ML-% IV SOLN
2.0000 g | INTRAVENOUS | Status: AC
  Administered 2021-10-22: 2 g via INTRAVENOUS

## 2021-10-22 MED ORDER — CHLORHEXIDINE GLUCONATE 0.12 % MT SOLN: 15.0000 mL | Freq: Once | OROMUCOSAL | Status: AC

## 2021-10-22 MED ORDER — OXYCODONE HCL 5 MG PO TABS: 5.0000 mg | ORAL_TABLET | Freq: Once | ORAL | Status: DC | PRN

## 2021-10-22 MED ORDER — DEXAMETHASONE SODIUM PHOSPHATE 10 MG/ML IJ SOLN
INTRAMUSCULAR | Status: DC | PRN
  Administered 2021-10-22: 5 mg via INTRAVENOUS

## 2021-10-22 SURGICAL SUPPLY — 57 items
ADH SKN CLS APL DERMABOND .7 (GAUZE/BANDAGES/DRESSINGS) ×2
ANCHOR TIS RET SYS 235ML (MISCELLANEOUS) ×2 IMPLANT
BAG PRESSURE INF REUSE 1000 (BAG) IMPLANT
BAG TISS RTRVL C235 10X14 (MISCELLANEOUS)
BLADE SURG SZ11 CARB STEEL (BLADE) ×2 IMPLANT
CANNULA REDUC XI 12-8 STAPL (CANNULA) ×2
CANNULA REDUCER 12-8 DVNC XI (CANNULA) ×2 IMPLANT
CATH REDDICK CHOLANGI 4FR 50CM (CATHETERS) IMPLANT
CLIP LIGATING HEMO O LOK GREEN (MISCELLANEOUS) ×2 IMPLANT
DERMABOND ADVANCED .7 DNX12 (GAUZE/BANDAGES/DRESSINGS) ×2 IMPLANT
DRAPE ARM DVNC X/XI (DISPOSABLE) ×8 IMPLANT
DRAPE C-ARM XRAY 36X54 (DRAPES) IMPLANT
DRAPE COLUMN DVNC XI (DISPOSABLE) ×2 IMPLANT
DRAPE DA VINCI XI ARM (DISPOSABLE) ×8
DRAPE DA VINCI XI COLUMN (DISPOSABLE) ×2
ELECT CAUTERY BLADE 6.4 (BLADE) ×2 IMPLANT
ELECT REM PT RETURN 9FT ADLT (ELECTROSURGICAL) ×2
ELECTRODE REM PT RTRN 9FT ADLT (ELECTROSURGICAL) ×2 IMPLANT
GLOVE BIOGEL PI IND STRL 7.0 (GLOVE) ×4 IMPLANT
GLOVE SURG SYN 6.5 ES PF (GLOVE) ×8 IMPLANT
GLOVE SURG SYN 6.5 PF PI (GLOVE) ×4 IMPLANT
GOWN STRL REUS W/ TWL LRG LVL3 (GOWN DISPOSABLE) ×6 IMPLANT
GOWN STRL REUS W/TWL LRG LVL3 (GOWN DISPOSABLE) ×6
GRASPER SUT TROCAR 14GX15 (MISCELLANEOUS) IMPLANT
IRRIGATOR SUCT 8 DISP DVNC XI (IRRIGATION / IRRIGATOR) IMPLANT
IRRIGATOR SUCTION 8MM XI DISP (IRRIGATION / IRRIGATOR)
IV NS 1000ML (IV SOLUTION)
IV NS 1000ML BAXH (IV SOLUTION) IMPLANT
LABEL OR SOLS (LABEL) ×2 IMPLANT
MANIFOLD NEPTUNE II (INSTRUMENTS) ×2 IMPLANT
NDL INSUFFLATION 14GA 120MM (NEEDLE) ×2 IMPLANT
NEEDLE HYPO 22GX1.5 SAFETY (NEEDLE) ×2 IMPLANT
NEEDLE INSUFFLATION 14GA 120MM (NEEDLE) ×2 IMPLANT
NS IRRIG 500ML POUR BTL (IV SOLUTION) ×2 IMPLANT
OBTURATOR OPTICAL STANDARD 8MM (TROCAR) ×2
OBTURATOR OPTICAL STND 8 DVNC (TROCAR) ×2
OBTURATOR OPTICALSTD 8 DVNC (TROCAR) ×2 IMPLANT
PACK LAP CHOLECYSTECTOMY (MISCELLANEOUS) ×2 IMPLANT
PENCIL SMOKE EVACUATOR (MISCELLANEOUS) ×2 IMPLANT
SCISSORS METZENBAUM CVD 33 (INSTRUMENTS) IMPLANT
SEAL CANN UNIV 5-8 DVNC XI (MISCELLANEOUS) ×6 IMPLANT
SEAL XI 5MM-8MM UNIVERSAL (MISCELLANEOUS) ×6
SET TUBE SMOKE EVAC HIGH FLOW (TUBING) ×2 IMPLANT
SOLUTION ELECTROLUBE (MISCELLANEOUS) ×2 IMPLANT
SPIKE FLUID TRANSFER (MISCELLANEOUS) ×4 IMPLANT
STAPLER CANNULA SEAL DVNC XI (STAPLE) ×2 IMPLANT
STAPLER CANNULA SEAL XI (STAPLE) ×2
SUT MNCRL 4-0 (SUTURE) ×6
SUT MNCRL 4-0 27XMFL (SUTURE) ×6
SUT VIC AB 3-0 SH 27 (SUTURE) ×2
SUT VIC AB 3-0 SH 27X BRD (SUTURE) IMPLANT
SUT VICRYL 0 AB UR-6 (SUTURE) ×2 IMPLANT
SUTURE MNCRL 4-0 27XMF (SUTURE) ×4 IMPLANT
SYR 30ML LL (SYRINGE) IMPLANT
SYSTEM WECK SHIELD CLOSURE (TROCAR) IMPLANT
TRAP FLUID SMOKE EVACUATOR (MISCELLANEOUS) ×2 IMPLANT
WATER STERILE IRR 500ML POUR (IV SOLUTION) ×2 IMPLANT

## 2021-10-22 NOTE — Transfer of Care (Signed)
Immediate Anesthesia Transfer of Care Note  Patient: Heather Snow  Procedure(s) Performed: XI ROBOTIC ASSISTED LAPAROSCOPIC CHOLECYSTECTOMY (Abdomen) INDOCYANINE GREEN FLUORESCENCE IMAGING (ICG)  Patient Location: PACU  Anesthesia Type:General  Level of Consciousness: awake and alert   Airway & Oxygen Therapy: Patient Spontanous Breathing and Patient connected to face mask oxygen  Post-op Assessment: Report given to RN and Post -op Vital signs reviewed and stable  Post vital signs: Reviewed and stable  Last Vitals:  Vitals Value Taken Time  BP 133/65 10/22/21 0915  Temp    Pulse 81 10/22/21 0917  Resp 19 10/22/21 0917  SpO2 100 % 10/22/21 0917  Vitals shown include unvalidated device data.  Last Pain:  Vitals:   10/22/21 0628  TempSrc: Oral  PainSc: 0-No pain         Complications: No notable events documented.

## 2021-10-22 NOTE — Op Note (Signed)
Preoperative diagnosis:  biliary dyskinesia  Postoperative diagnosis: Chronic cholecystitis  Procedure: Robotic assisted Laparoscopic Cholecystectomy.   Anesthesia: GETA   Surgeon: Benjamine Sprague  Specimen: Gallbladder  Complications: None  EBL: 38mL  Wound Classification: Clean Contaminated  Indications: see HPI  Findings: Critical view of safety noted Cystic duct and artery identified, ligated and divided, clips remained intact at end of procedure Adequate hemostasis  Description of procedure:  The patient was placed on the operating table in the supine position. SCDs placed, pre-op abx administered.  General anesthesia was induced and OG tube placed by anesthesia. A time-out was completed verifying correct patient, procedure, site, positioning, and implant(s) and/or special equipment prior to beginning this procedure. The abdomen was prepped and draped in the usual sterile fashion.    Veress needle was placed at the Palmer's point and insufflation was started after confirming a positive saline drop test and no immediate increase in abdominal pressure.  After reaching 15 mm, the Veress needle was removed and a 8 mm port was placed via optiview technique under umbilicus measured 00XF from gallbladder.  The abdomen was inspected and no abnormalities or injuries were found.  Under direct vision, ports were placed in the following locations: One 12 mm patient left of the umbilicus, 8cm from the optiviewed port, one 8 mm port placed to the patient right of the umbilical port 8 cm apart.  1 additional 8 mm port placed lateral to the 27mm port.    Visual inspection of the entire ascending colon and proximal transverse colon noted a single adhesive band around the patient's area of tenderness previously noted in right lower quadrant.  No sign of active diverticular disease on exam.  The single adhesive band was cut.  The table was placed in the reverse Trendelenburg position with the right  side up. The Xi platform was brought into the operative field and docked to the ports successfully.  An endoscope was placed through the umbilical port, fenestrated grasper through the adjacent patient right port, prograsp to the far patient left port, and then a hook cautery in the left port.  The dome of the gallbladder was grasped with prograsp, passed and retracted over the dome of the liver.  Very obvious chronic cholecystitis noted with adhesions between the gallbladder and omentum, duodenum and transverse colon.  This was separated via hook cautery. The infundibulum was grasped with the fenestrated grasper and retracted toward the right lower quadrant. This maneuver exposed Calot's triangle. The thin peritoneum overlying the gallbladder infundibulum was then dissected  and the cystic duct and cystic artery identified.  Critical view of safety with the liver bed clearly visible behind the duct and artery with no additional structures noted.  The cystic duct and cystic artery clipped and divided close to the gallbladder.     The gallbladder was then dissected from its peritoneal and liver bed attachments by electrocautery. Hemostasis was checked prior to removing the hook cautery and the Endo Catch bag was then placed through the 12 mm port and the gallbladder was removed.  The gallbladder was passed off the table as a specimen. There was no evidence of bleeding from the gallbladder fossa or cystic artery or leakage of the bile from the cystic duct stump. The 12 mm port site closed with 0 Vicryl x1 under direct visualization.  All secondary trocars removed.  No bleeding was noted.  Deep dermal sutures used to approximate the 12 mm port site using 3-0 Vicryl.  All skin incisions then  closed with subcuticular sutures of 4-0 monocryl and dressed with topical skin adhesive. The orogastric tube was removed and patient extubated.  The patient tolerated the procedure well and was taken to the postanesthesia care  unit in stable condition.  All sponge and instrument count correct at end of procedure.

## 2021-10-22 NOTE — Discharge Instructions (Addendum)
Laparoscopic Cholecystectomy, Care After This sheet gives you information about how to care for yourself after your procedure. Your doctor may also give you more specific instructions. If you have problems or questions, contact your doctor. Follow these instructions at home: Care for cuts from surgery (incisions)  Follow instructions from your doctor about how to take care of your cuts from surgery. Make sure you: Wash your hands with soap and water before you change your bandage (dressing). If you cannot use soap and water, use hand sanitizer. Change your bandage as told by your doctor. Leave stitches (sutures), skin glue, or skin tape (adhesive) strips in place. They may need to stay in place for 2 weeks or longer. If tape strips get loose and curl up, you may trim the loose edges. Do not remove tape strips completely unless your doctor says it is okay. Do not take baths, swim, or use a hot tub until your doctor says it is okay. OK TO SHOWER 24HRS AFTER YOUR SURGERY.  Check your surgical cut area every day for signs of infection. Check for: More redness, swelling, or pain. More fluid or blood. Warmth. Pus or a bad smell. Activity Do not drive or use heavy machinery while taking prescription pain medicine. Do not play contact sports until your doctor says it is okay. Do not drive for 24 hours if you were given a medicine to help you relax (sedative). Rest as needed. Do not return to work or school until your doctor says it is okay. General instructions  tylenol as needed for discomfort.    Use narcotics, if prescribed, only when tylenol  is not enough to control pain.  325-650mg  every 8hrs to max of 3000mg /24hrs (including the 325mg  in every norco dose) for the tylenol.    To prevent or treat constipation while you are taking prescription pain medicine, your doctor may recommend that you: Drink enough fluid to keep your pee (urine) clear or pale yellow. Take over-the-counter or  prescription medicines. Eat foods that are high in fiber, such as fresh fruits and vegetables, whole grains, and beans. Limit foods that are high in fat and processed sugars, such as fried and sweet foods. Contact a doctor if: You develop a rash. You have more redness, swelling, or pain around your surgical cuts. You have more fluid or blood coming from your surgical cuts. Your surgical cuts feel warm to the touch. You have pus or a bad smell coming from your surgical cuts. You have a fever. One or more of your surgical cuts breaks open. You have trouble breathing. You have chest pain. You have pain that is getting worse in your shoulders. You faint or feel dizzy when you stand. You have very bad pain in your belly (abdomen). You are sick to your stomach (nauseous) for more than one day. You have throwing up (vomiting) that lasts for more than one day. You have leg pain. This information is not intended to replace advice given to you by your health care provider. Make sure you discuss any questions you have with your health care provider. Document Released: 10/28/2007 Document Revised: 08/09/2015 Document Reviewed: 07/07/2015 Elsevier Interactive Patient Education  2019 [employer]    AMBULATORY SURGERY  DISCHARGE INSTRUCTIONS   The drugs that you were given will stay in your system until tomorrow so for the next 24 hours you should not:  Drive an automobile Make any legal decisions Drink any alcoholic beverage   You may resume regular meals tomorrow.  Today it is better to start with liquids and gradually work up to solid foods.  You may eat anything you prefer, but it is better to start with liquids, then soup and crackers, and gradually work up to solid foods.   Please notify your doctor immediately if you have any unusual bleeding, trouble breathing, redness and pain at the surgery site, drainage, fever, or pain not relieved by medication.    Additional  Instructions:        Please contact your physician with any problems or Same Day Surgery at [phone], Monday through Friday 6 am to 4 pm, or [facility] number at [phone].

## 2021-10-22 NOTE — Anesthesia Procedure Notes (Signed)
Procedure Name: Intubation Date/Time: 10/22/2021 7:38 AM  Performed by: Fredderick Phenix, CRNAPre-anesthesia Checklist: Patient identified, Emergency Drugs available, Suction available and Patient being monitored Patient Re-evaluated:Patient Re-evaluated prior to induction Oxygen Delivery Method: Circle system utilized Preoxygenation: Pre-oxygenation with 100% oxygen Induction Type: IV induction Ventilation: Mask ventilation without difficulty Laryngoscope Size: Mac and 3 Grade View: Grade I Tube type: Oral Tube size: 7.0 mm Number of attempts: 1 Airway Equipment and Method: Stylet and Oral airway Placement Confirmation: ETT inserted through vocal cords under direct vision, positive ETCO2 and breath sounds checked- equal and bilateral Secured at: 20 cm Tube secured with: Tape Dental Injury: Teeth and Oropharynx as per pre-operative assessment
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
## 2021-10-22 NOTE — OR Nursing (Signed)
May resume taking aspirin in 48 hours per Dr. Lysle Pearl secure-chat; added to discharge instructions/med section)

## 2021-10-22 NOTE — Anesthesia Postprocedure Evaluation (Signed)
Anesthesia Post Note  Patient: Heather Snow  Procedure(s) Performed: XI ROBOTIC ASSISTED LAPAROSCOPIC CHOLECYSTECTOMY (Abdomen) INDOCYANINE GREEN FLUORESCENCE IMAGING (ICG)  Patient location during evaluation: PACU Anesthesia Type: General Level of consciousness: awake and alert Pain management: pain level controlled Vital Signs Assessment: post-procedure vital signs reviewed and stable Respiratory status: spontaneous breathing, nonlabored ventilation, respiratory function stable and patient connected to nasal cannula oxygen Cardiovascular status: blood pressure returned to baseline and stable Postop Assessment: no apparent nausea or vomiting Anesthetic complications: no   No notable events documented.   Last Vitals:  Vitals:   10/22/21 1000 10/22/21 1020  BP: 139/81 (!) 142/76  Pulse: (!) 59 60  Resp: 10 14  Temp: (!) 36.4 C (!) 36.3 C  SpO2: 100% 99%    Last Pain:  Vitals:   10/22/21 1020  TempSrc: Temporal  PainSc: 0-No pain                 Arita Miss

## 2021-10-22 NOTE — Interval H&P Note (Signed)
No change. OK to proceed.

## 2021-10-23 LAB — SURGICAL PATHOLOGY

## 2022-02-08 ENCOUNTER — Telehealth: Payer: Self-pay | Admitting: Internal Medicine

## 2022-02-08 ENCOUNTER — Other Ambulatory Visit: Payer: Self-pay | Admitting: Internal Medicine

## 2022-02-08 DIAGNOSIS — Z1231 Encounter for screening mammogram for malignant neoplasm of breast: Secondary | ICD-10-CM

## 2022-02-08 NOTE — Telephone Encounter (Signed)
Would need office visit with me, we can discuss options for repeat colonoscopy versus another modality

## 2022-02-08 NOTE — Telephone Encounter (Signed)
Good morning Dr. Hilarie Fredrickson,    We received a call from this patient this morning, she is requesting a second opinion and possible transfer of care with you. She stated she was referred by her pastor who gave great recommendations about you. This patient had a colonoscopy in 10/2021 with Dr. Launa Flight at [facility]. She also sees Dr. Haig Prophet at [facility]. She has an appointment with Dr. Haig Prophet on 2/21 for a follow up to see if she should get a repeat colonoscopy due to troubles on her last one. However, patient would like an appointment with you for a second opinion and your advice. Records are in Epic for your review. Please advise on scheduling.    Thank you.

## 2022-02-11 ENCOUNTER — Encounter: Payer: Self-pay | Admitting: Internal Medicine

## 2022-03-01 ENCOUNTER — Ambulatory Visit
Admission: RE | Admit: 2022-03-01 | Discharge: 2022-03-01 | Disposition: A | Discharge status: Home / Self Care | Payer: Medicare PPO | Source: Ambulatory Visit | Attending: Internal Medicine | Admitting: Internal Medicine

## 2022-03-01 DIAGNOSIS — Z1231 Encounter for screening mammogram for malignant neoplasm of breast: Secondary | ICD-10-CM | POA: Diagnosis not present

## 2022-03-15 ENCOUNTER — Encounter: Payer: Self-pay | Admitting: *Deleted

## 2022-03-15 ENCOUNTER — Other Ambulatory Visit: Payer: Self-pay | Admitting: Internal Medicine

## 2022-03-15 DIAGNOSIS — R1900 Intra-abdominal and pelvic swelling, mass and lump, unspecified site: Secondary | ICD-10-CM

## 2022-04-13 ENCOUNTER — Ambulatory Visit
Admission: RE | Admit: 2022-04-13 | Discharge: 2022-04-13 | Disposition: A | Discharge status: Home / Self Care | Payer: Medicare PPO | Source: Ambulatory Visit | Attending: Internal Medicine | Admitting: Internal Medicine

## 2022-04-13 DIAGNOSIS — R1900 Intra-abdominal and pelvic swelling, mass and lump, unspecified site: Secondary | ICD-10-CM

## 2022-04-13 MED ORDER — GADOPICLENOL 0.5 MMOL/ML IV SOLN
6.0000 mL | Freq: Once | INTRAVENOUS | Status: AC | PRN
  Administered 2022-04-13: 6 mL via INTRAVENOUS

## 2022-04-15 ENCOUNTER — Telehealth: Payer: Self-pay

## 2022-04-15 ENCOUNTER — Ambulatory Visit: Payer: Medicare PPO | Admitting: Internal Medicine

## 2022-04-15 ENCOUNTER — Encounter: Payer: Self-pay | Admitting: Internal Medicine

## 2022-04-15 VITALS — BP 116/82 | HR 74 | Ht 67.0 in | Wt 129.0 lb

## 2022-04-15 DIAGNOSIS — Z8719 Personal history of other diseases of the digestive system: Secondary | ICD-10-CM | POA: Diagnosis not present

## 2022-04-15 DIAGNOSIS — D490 Neoplasm of unspecified behavior of digestive system: Secondary | ICD-10-CM

## 2022-04-15 DIAGNOSIS — Q438 Other specified congenital malformations of intestine: Secondary | ICD-10-CM | POA: Diagnosis not present

## 2022-04-15 MED ORDER — NA SULFATE-K SULFATE-MG SULF 17.5-3.13-1.6 GM/177ML PO SOLN: 1.0000 | Freq: Once | ORAL | 0 refills | Status: AC
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
## 2022-04-15 NOTE — Patient Instructions (Signed)
You have been scheduled for a colonoscopy. Please follow written instructions given to you at your visit today.  Please pick up your prep supplies at the pharmacy within the next 1-3 days. If you use inhalers (even only as needed), please bring them with you on the day of your procedure.  _______________________________________________________  If your blood pressure at your visit was 140/90 or greater, please contact your primary care physician to follow up on this.  _______________________________________________________  If you are age 68 or older, your body mass index should be between 23-30. Your Body mass index is 21.35 kg/m. If this is out of the aforementioned range listed, please consider follow up with your Primary Care Provider.  If you are age 80 or younger, your body mass index should be between 19-25. Your Body mass index is 21.35 kg/m. If this is out of the aformentioned range listed, please consider follow up with your Primary Care Provider.   ________________________________________________________  The [facility] providers would like to encourage you to use [facility] to communicate with providers for non-urgent requests or questions.  Due to long hold times on the telephone, sending your provider a message by [facility] may be a faster and more efficient way to get a response.  Please allow 48 business hours for a response.  Please remember that this is for non-urgent requests.  _______________________________________________________

## 2022-04-15 NOTE — Telephone Encounter (Signed)
Patient called had concerns about vitals on AVS patient reported that height and weight were not what she believed was correct height was incorrectly recorded as 5'4 weight of 125 height was updated to 5'7 and weight was updated to 129. We apologized for the inconvenience and new AVS was mailed to the patient.

## 2022-04-16 ENCOUNTER — Encounter: Payer: Self-pay | Admitting: Internal Medicine

## 2022-04-16 MED ORDER — AMBULATORY NON FORMULARY MEDICATION: Status: AC

## 2022-04-16 NOTE — Addendum Note (Signed)
Addended by: Larina Bras on: 04/16/2022 05:22 PM   Modules accepted: Orders

## 2022-05-14 IMAGING — MG MM DIGITAL SCREENING BILAT W/ TOMO AND CAD
8 of 14 series · 8 of 40 positions shown · non-contrast
Comparison: Previous exam(s).

CLINICAL DATA: Screening.

EXAM:
DIGITAL SCREENING BILATERAL MAMMOGRAM WITH TOMOSYNTHESIS AND CAD
TECHNIQUE: Bilateral screening digital craniocaudal and mediolateral oblique
mammograms were obtained. Bilateral screening digital breast
tomosynthesis was performed. The images were evaluated with
computer-aided detection.

[L CC synth-2D (1 of 2)]
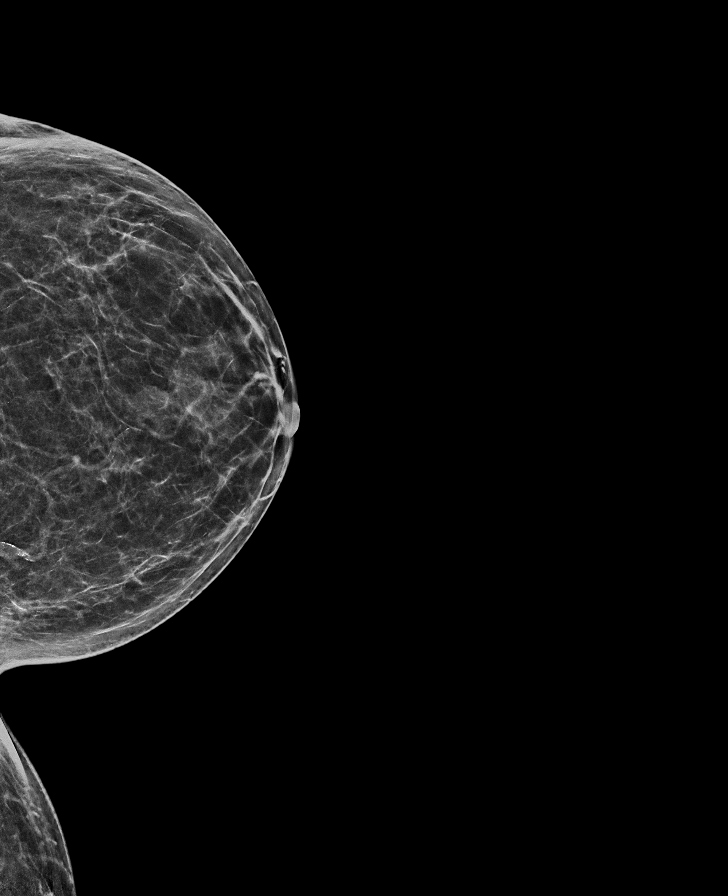

[L CC synth-2D (2 of 2)]
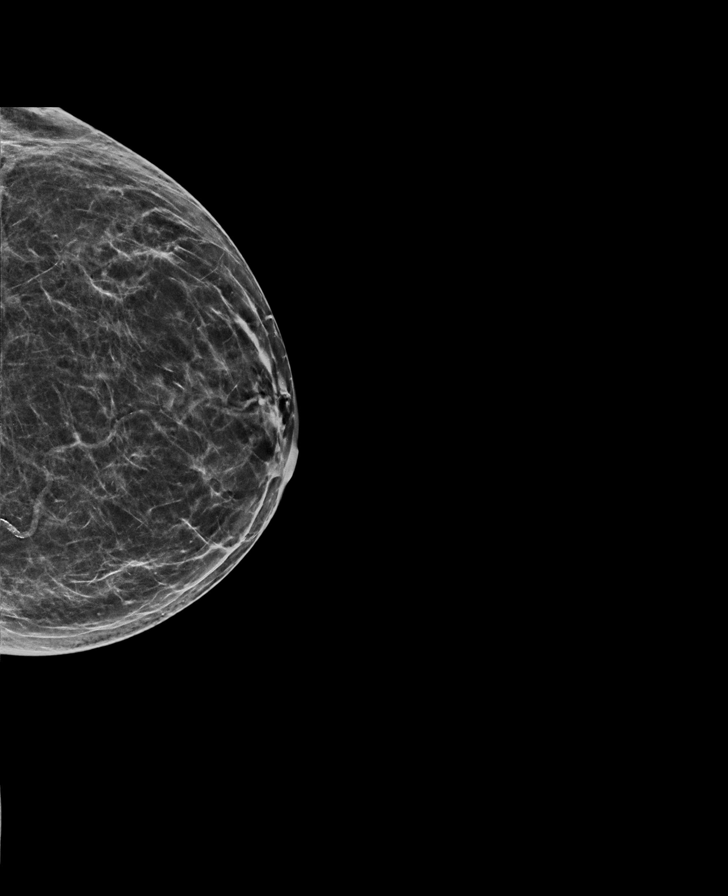

[R CC synth-2D (1 of 2)]
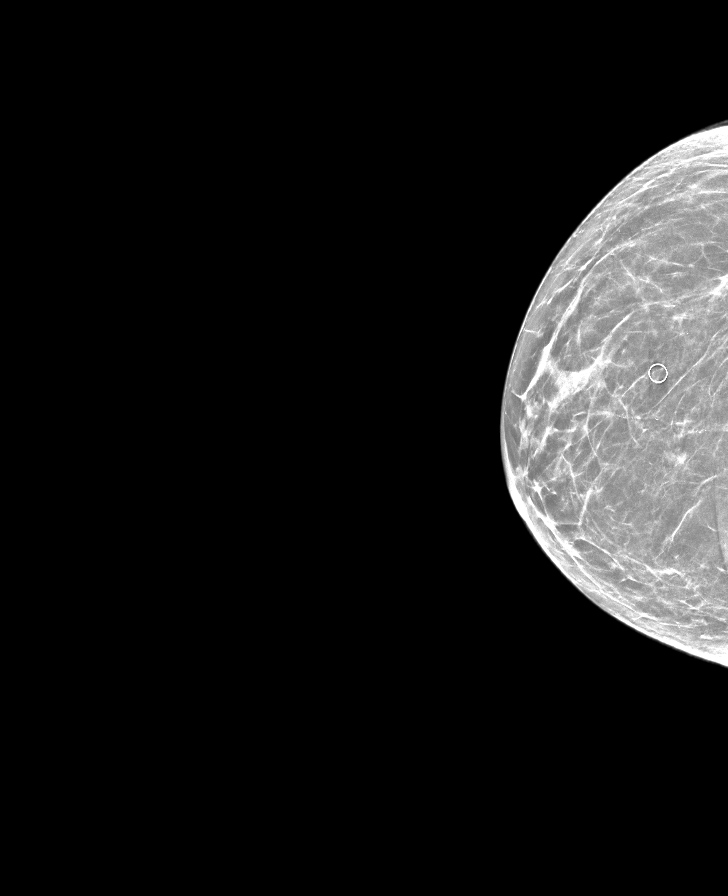

[R MLO synth-2D (1 of 2)]
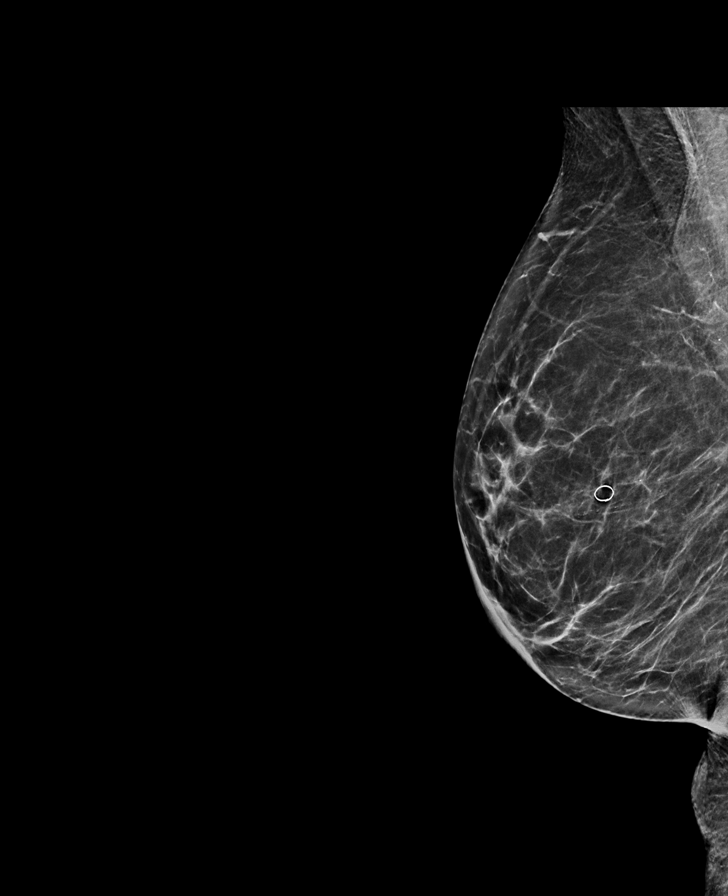

[R MLO synth-2D (2 of 2)]
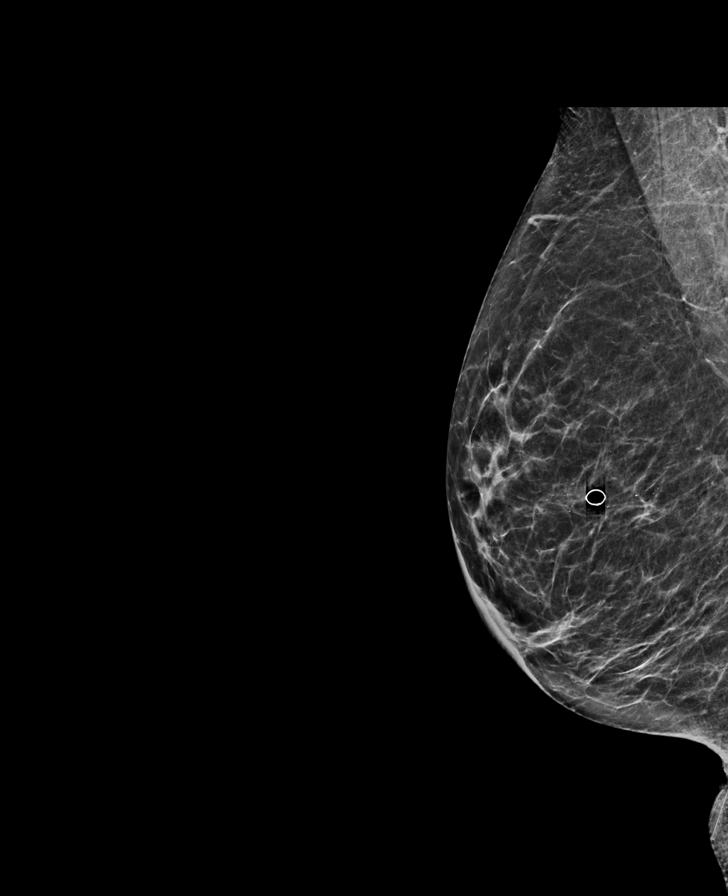

[L MLO synth-2D]
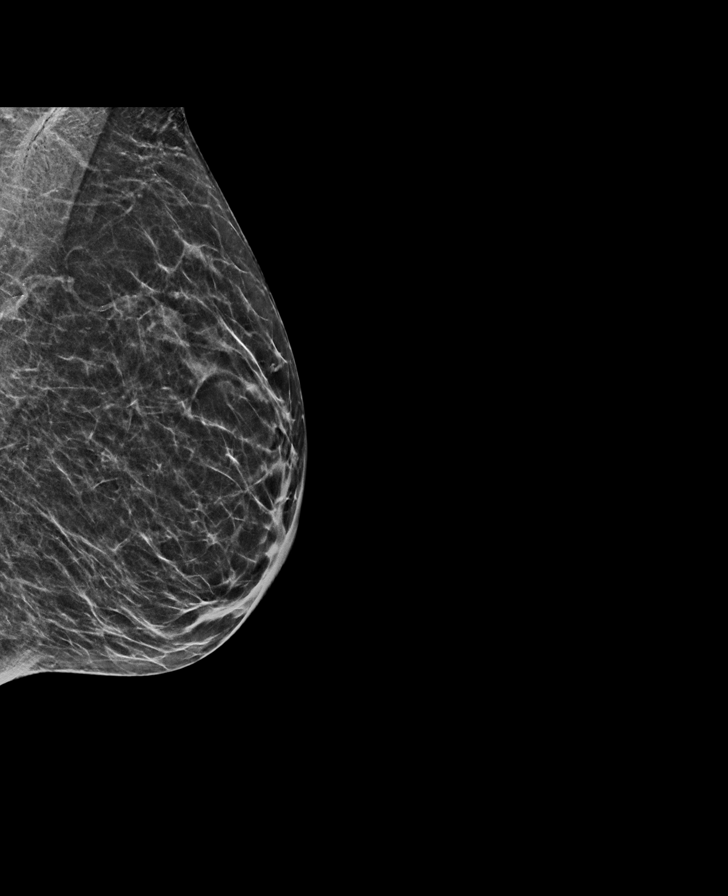

[R CC synth-2D (2 of 2)]
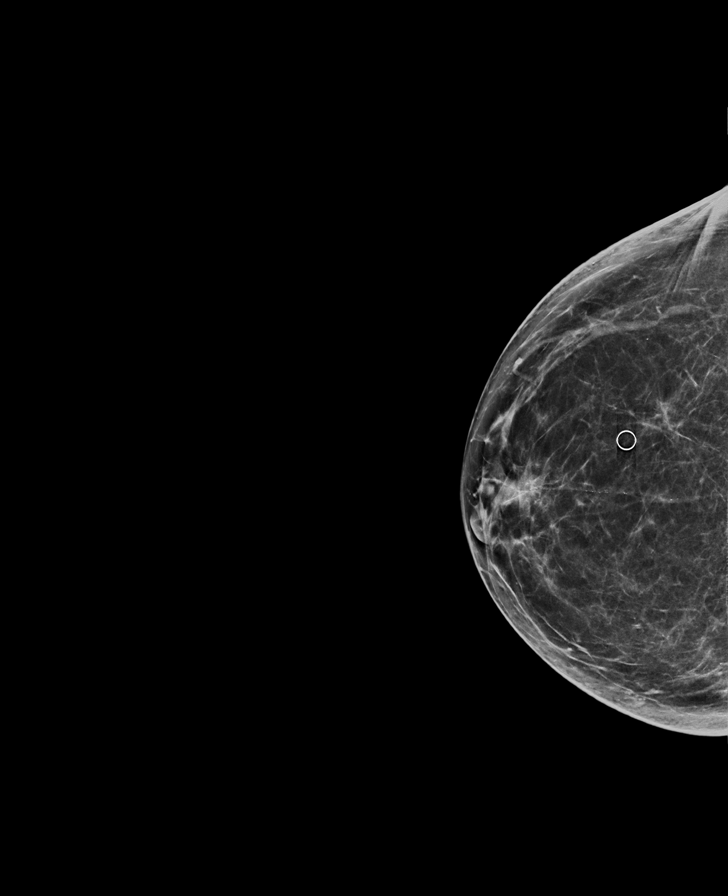

[R MLO tomo · tomo slice 29/57.0]
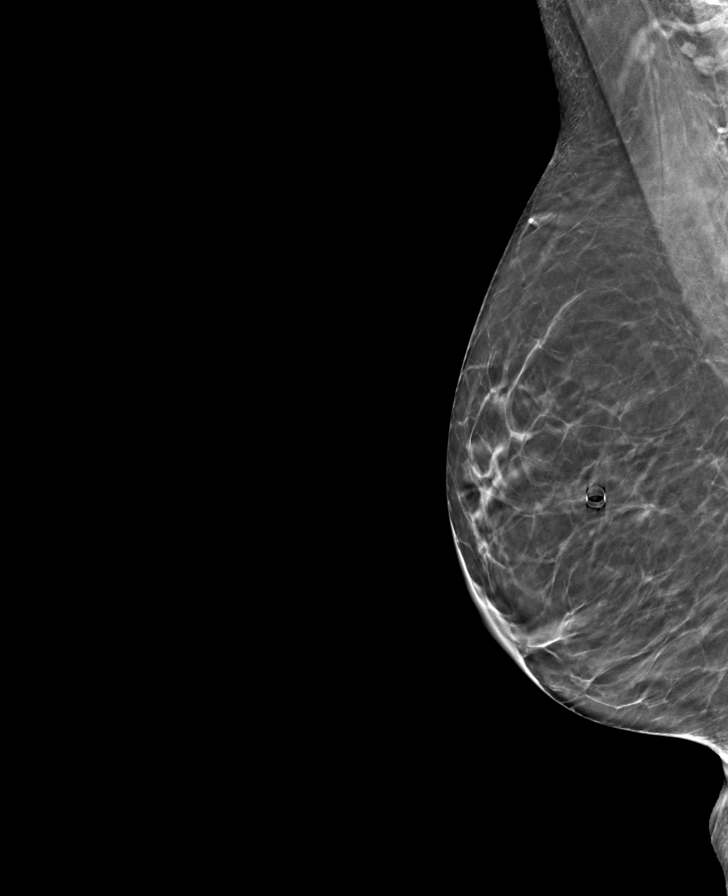

[8 of 40 positions shown; findings below may reference images not displayed]

ACR Breast Density Category b: There are scattered areas of
fibroglandular density.
FINDINGS: There are no findings suspicious for malignancy.
IMPRESSION: No mammographic evidence of malignancy. A result letter of this
screening mammogram will be mailed directly to the patient.

RECOMMENDATION:
Screening mammogram in one year. (Code:51-O-LD2)

BI-RADS CATEGORY  1: Negative.

## 2022-05-26 ENCOUNTER — Encounter: Payer: Self-pay | Admitting: Internal Medicine

## 2022-05-26 ENCOUNTER — Ambulatory Visit (AMBULATORY_SURGERY_CENTER): Payer: Medicare PPO | Admitting: Internal Medicine

## 2022-05-26 VITALS — BP 118/67 | HR 54 | Temp 98.6°F | Resp 17 | Ht 67.0 in | Wt 129.0 lb

## 2022-05-26 DIAGNOSIS — K644 Residual hemorrhoidal skin tags: Secondary | ICD-10-CM

## 2022-05-26 DIAGNOSIS — K5732 Diverticulitis of large intestine without perforation or abscess without bleeding: Secondary | ICD-10-CM | POA: Diagnosis not present

## 2022-05-26 DIAGNOSIS — Z8719 Personal history of other diseases of the digestive system: Secondary | ICD-10-CM | POA: Diagnosis not present

## 2022-05-26 MED ORDER — SODIUM CHLORIDE 0.9 % IV SOLN: 500.0000 mL | Freq: Once | INTRAVENOUS | Status: DC

## 2022-05-26 NOTE — Op Note (Signed)
[facility] Patient Name: Heather Snow Procedure Date: 05/26/2022 1:25 PM MRN: [id number] Endoscopist: Beverley Fiedler , MD, [id number] Age: 67 Referring MD:  Date of Birth: 10-Jan-1956 Gender: Female Account #: [id number] Procedure:                Colonoscopy Indications:              Follow-up of diverticulitis, incomplete colonoscopy                            in Sept 2023 due to diverticulosis and associated                            tortuosity Medicines:                Monitored Anesthesia Care Procedure:                Pre-Anesthesia Assessment:                           - Prior to the procedure, a History and Physical                            was performed, and patient medications and                            allergies were reviewed. The patient's tolerance of                            previous anesthesia was also reviewed. The risks                            and benefits of the procedure and the sedation                            options and risks were discussed with the patient.                            All questions were answered, and informed consent                            was obtained. Prior Anticoagulants: The patient has                            taken no anticoagulant or antiplatelet agents. ASA                            Grade Assessment: II - A patient with mild systemic                            disease. After reviewing the risks and benefits,                            the patient was deemed in satisfactory condition to  undergo the procedure.                           After obtaining informed consent, the colonoscope                            was passed under direct vision. Throughout the                            procedure, the patient's blood pressure, pulse, and                            oxygen saturations were monitored continuously. The                            PCF-[id number] Slim SN [id number] was introduced  through                            the anus and advanced to the cecum, identified by                            appendiceal orifice and ileocecal valve. The                            colonoscopy was somewhat difficult due to multiple                            diverticula in the colon and restricted mobility of                            the colon. The patient tolerated the procedure                            well. The quality of the bowel preparation was                            good. The ileocecal valve, appendiceal orifice, and                            rectum were photographed. Scope In: 1:43:42 PM Scope Out: 2:03:27 PM Scope Withdrawal Time: 0 hours 10 minutes 11 seconds  Total Procedure Duration: 0 hours 19 minutes 45 seconds  Findings:                 The digital rectal exam was normal.                           Multiple small-mouthed diverticula were found in                            the sigmoid colon. There was angulation and                            restricted mobility in the distal sigmoid colon in  association with the diverticular openings.                           Internal hemorrhoids were found during                            retroflexion. The hemorrhoids were small.                           The exam was otherwise without abnormality. Complications:            No immediate complications. Estimated Blood Loss:     Estimated blood loss: none. Impression:               - Moderate diverticulosis in the sigmoid colon.                            There was angulation and restricted mobility of the                            sigmoid colon in association with the diverticular                            openings.                           - Small internal hemorrhoids.                           - The examination was otherwise normal.                           - No specimens collected. Recommendation:           - Patient has a contact number  available for                            emergencies. The signs and symptoms of potential                            delayed complications were discussed with the                            patient. Return to normal activities tomorrow.                            Written discharge instructions were provided to the                            patient.                           - Resume previous diet.                           - Continue present medications.                           -  No recommendation at this time regarding repeat                            colonoscopy due to age at next screening interval                            (10 yrs) and no polyps on today's exam. Beverley Fiedler, MD 05/26/2022 2:09:29 PM This report has been signed electronically.

## 2022-05-26 NOTE — Progress Notes (Signed)
Uneventful anesthetic. Report to pacu rn. Vss. Care resumed by rn. 
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
## 2022-05-26 NOTE — Patient Instructions (Addendum)
Handouts given on hemorrhoids and diverticulosis.  YOU HAD AN ENDOSCOPIC PROCEDURE TODAY AT THE [facility]:   Refer to the procedure report that was given to you for any specific questions about what was found during the examination.  If the procedure report does not answer your questions, please call your gastroenterologist to clarify.  If you requested that your care partner not be given the details of your procedure findings, then the procedure report has been included in a sealed envelope for you to review at your convenience later.  YOU SHOULD EXPECT: Some feelings of bloating in the abdomen. Passage of more gas than usual.  Walking can help get rid of the air that was put into your GI tract during the procedure and reduce the bloating. If you had a lower endoscopy (such as a colonoscopy or flexible sigmoidoscopy) you may notice spotting of blood in your stool or on the toilet paper. If you underwent a bowel prep for your procedure, you may not have a normal bowel movement for a few days.  Please Note:  You might notice some irritation and congestion in your nose or some drainage.  This is from the oxygen used during your procedure.  There is no need for concern and it should clear up in a day or so.  SYMPTOMS TO REPORT IMMEDIATELY:  Following lower endoscopy (colonoscopy or flexible sigmoidoscopy):  Excessive amounts of blood in the stool  Significant tenderness or worsening of abdominal pains  Swelling of the abdomen that is new, acute  Fever of 100F or higher  For urgent or emergent issues, a gastroenterologist can be reached at any hour by calling [phone]. Do not use MyChart messaging for urgent concerns.    DIET:  We do recommend a small meal at first, but then you may proceed to your regular diet.  Drink plenty of fluids but you should avoid alcoholic beverages for 24 hours.  ACTIVITY:  You should plan to take it easy for the rest of today and you should NOT  DRIVE or use heavy machinery until tomorrow (because of the sedation medicines used during the test).    FOLLOW UP: Our staff will call the number listed on your records the next business day following your procedure.  We will call around 7:15- 8:00 am to check on you and address any questions or concerns that you may have regarding the information given to you following your procedure. If we do not reach you, we will leave a message.     If any biopsies were taken you will be contacted by phone or by letter within the next 1-3 weeks.  Please call us at [phone] if you have not heard about the biopsies in 3 weeks.    SIGNATURES/CONFIDENTIALITY: You and/or your care partner have signed paperwork which will be entered into your electronic medical record.  These signatures attest to the fact that that the information above on your After Visit Summary has been reviewed and is understood.  Full responsibility of the confidentiality of this discharge information lies with you and/or your care-partner. 

## 2022-05-26 NOTE — Progress Notes (Signed)
VS completed by DT.   I have reviewed the patient's medical history in detail and updated the computerized patient record.  

## 2023-02-18 ENCOUNTER — Other Ambulatory Visit: Payer: Self-pay | Admitting: Internal Medicine

## 2023-02-18 DIAGNOSIS — Z1231 Encounter for screening mammogram for malignant neoplasm of breast: Secondary | ICD-10-CM

## 2023-03-03 ENCOUNTER — Ambulatory Visit
Admission: RE | Admit: 2023-03-03 | Discharge: 2023-03-03 | Disposition: A | Discharge status: Home / Self Care | Payer: Medicare PPO | Source: Ambulatory Visit | Attending: Internal Medicine | Admitting: Internal Medicine

## 2023-03-03 DIAGNOSIS — Z1231 Encounter for screening mammogram for malignant neoplasm of breast: Secondary | ICD-10-CM | POA: Insufficient documentation

## 2023-03-16 ENCOUNTER — Other Ambulatory Visit: Payer: Self-pay | Admitting: Medical Genetics

## 2023-03-22 ENCOUNTER — Other Ambulatory Visit
Admission: RE | Admit: 2023-03-22 | Discharge: 2023-03-22 | Disposition: A | Discharge status: Home / Self Care | Payer: Self-pay | Source: Ambulatory Visit | Attending: Medical Genetics | Admitting: Medical Genetics

## 2023-03-23 ENCOUNTER — Other Ambulatory Visit: Payer: Self-pay

## 2023-04-03 LAB — GENECONNECT MOLECULAR SCREEN: Genetic Analysis Overall Interpretation: NEGATIVE

## 2024-01-18 ENCOUNTER — Other Ambulatory Visit: Payer: Self-pay | Admitting: Internal Medicine

## 2024-01-18 DIAGNOSIS — Z1231 Encounter for screening mammogram for malignant neoplasm of breast: Secondary | ICD-10-CM

## 2024-03-06 ENCOUNTER — Other Ambulatory Visit: Payer: Self-pay | Admitting: Internal Medicine

## 2024-03-06 DIAGNOSIS — Z1231 Encounter for screening mammogram for malignant neoplasm of breast: Secondary | ICD-10-CM

## 2024-03-08 ENCOUNTER — Encounter

## 2024-03-08 ENCOUNTER — Inpatient Hospital Stay: Admission: RE | Admit: 2024-03-08 | Discharge: 2024-03-08 | Attending: Internal Medicine | Admitting: Internal Medicine

## 2024-03-08 DIAGNOSIS — Z1231 Encounter for screening mammogram for malignant neoplasm of breast: Secondary | ICD-10-CM
